# Patient Record
Sex: Female | Born: 1940
Health system: Southern US, Community
[De-identification: ages and names within clinical notes are randomized; demographics above are authoritative.]

## PROBLEM LIST (undated history)

## (undated) DIAGNOSIS — C8338 Diffuse large B-cell lymphoma, lymph nodes of multiple sites: Secondary | ICD-10-CM

## (undated) DIAGNOSIS — C8330 Diffuse large B-cell lymphoma, unspecified site: Secondary | ICD-10-CM

## (undated) HISTORY — DX: Diffuse large b-cell lymphoma, lymph nodes of multiple sites: C83.38

## (undated) HISTORY — DX: Diffuse large B-cell lymphoma, unspecified site: C83.30

## (undated) HISTORY — PX: TONSILLECTOMY: SUR1361

---

## 2009-08-18 ENCOUNTER — Ambulatory Visit: Admission: RE | Admit: 2009-08-18 | Discharge: 2009-08-18 | Payer: Self-pay | Admitting: Gynecologic Oncology

## 2014-01-21 DIAGNOSIS — J309 Allergic rhinitis, unspecified: Secondary | ICD-10-CM | POA: Diagnosis not present

## 2014-01-21 DIAGNOSIS — Z681 Body mass index (BMI) 19 or less, adult: Secondary | ICD-10-CM | POA: Diagnosis not present

## 2014-01-21 DIAGNOSIS — J0100 Acute maxillary sinusitis, unspecified: Secondary | ICD-10-CM | POA: Diagnosis not present

## 2014-01-21 DIAGNOSIS — M810 Age-related osteoporosis without current pathological fracture: Secondary | ICD-10-CM | POA: Diagnosis not present

## 2014-01-21 DIAGNOSIS — M159 Polyosteoarthritis, unspecified: Secondary | ICD-10-CM | POA: Diagnosis not present

## 2014-03-03 DIAGNOSIS — J309 Allergic rhinitis, unspecified: Secondary | ICD-10-CM | POA: Diagnosis not present

## 2014-03-03 DIAGNOSIS — R002 Palpitations: Secondary | ICD-10-CM | POA: Diagnosis not present

## 2014-03-03 DIAGNOSIS — Z681 Body mass index (BMI) 19 or less, adult: Secondary | ICD-10-CM | POA: Diagnosis not present

## 2014-03-03 DIAGNOSIS — M159 Polyosteoarthritis, unspecified: Secondary | ICD-10-CM | POA: Diagnosis not present

## 2014-03-03 DIAGNOSIS — M810 Age-related osteoporosis without current pathological fracture: Secondary | ICD-10-CM | POA: Diagnosis not present

## 2014-05-23 DIAGNOSIS — J0100 Acute maxillary sinusitis, unspecified: Secondary | ICD-10-CM | POA: Diagnosis not present

## 2014-08-21 DIAGNOSIS — H5203 Hypermetropia, bilateral: Secondary | ICD-10-CM | POA: Diagnosis not present

## 2014-08-21 DIAGNOSIS — H43813 Vitreous degeneration, bilateral: Secondary | ICD-10-CM | POA: Diagnosis not present

## 2014-10-24 DIAGNOSIS — Z23 Encounter for immunization: Secondary | ICD-10-CM | POA: Diagnosis not present

## 2014-11-17 DIAGNOSIS — Z01419 Encounter for gynecological examination (general) (routine) without abnormal findings: Secondary | ICD-10-CM | POA: Diagnosis not present

## 2014-11-17 DIAGNOSIS — E039 Hypothyroidism, unspecified: Secondary | ICD-10-CM | POA: Diagnosis not present

## 2014-11-17 DIAGNOSIS — N83202 Unspecified ovarian cyst, left side: Secondary | ICD-10-CM | POA: Diagnosis not present

## 2014-11-17 DIAGNOSIS — N8111 Cystocele, midline: Secondary | ICD-10-CM | POA: Diagnosis not present

## 2014-11-24 DIAGNOSIS — N839 Noninflammatory disorder of ovary, fallopian tube and broad ligament, unspecified: Secondary | ICD-10-CM | POA: Diagnosis not present

## 2014-11-27 DIAGNOSIS — Z1231 Encounter for screening mammogram for malignant neoplasm of breast: Secondary | ICD-10-CM | POA: Diagnosis not present

## 2014-11-28 DIAGNOSIS — R928 Other abnormal and inconclusive findings on diagnostic imaging of breast: Secondary | ICD-10-CM | POA: Diagnosis not present

## 2014-11-28 DIAGNOSIS — R590 Localized enlarged lymph nodes: Secondary | ICD-10-CM | POA: Diagnosis not present

## 2014-12-01 ENCOUNTER — Other Ambulatory Visit (HOSPITAL_COMMUNITY)
Admission: RE | Admit: 2014-12-01 | Disposition: A | Discharge status: Home / Self Care | Payer: Medicare Other | Source: Ambulatory Visit | Attending: Obstetrics and Gynecology | Admitting: Obstetrics and Gynecology

## 2014-12-01 DIAGNOSIS — R590 Localized enlarged lymph nodes: Secondary | ICD-10-CM | POA: Diagnosis not present

## 2014-12-01 DIAGNOSIS — R928 Other abnormal and inconclusive findings on diagnostic imaging of breast: Secondary | ICD-10-CM | POA: Diagnosis not present

## 2014-12-18 DIAGNOSIS — M159 Polyosteoarthritis, unspecified: Secondary | ICD-10-CM | POA: Diagnosis not present

## 2014-12-18 DIAGNOSIS — J309 Allergic rhinitis, unspecified: Secondary | ICD-10-CM | POA: Diagnosis not present

## 2014-12-18 DIAGNOSIS — Z9181 History of falling: Secondary | ICD-10-CM | POA: Diagnosis not present

## 2014-12-18 DIAGNOSIS — J028 Acute pharyngitis due to other specified organisms: Secondary | ICD-10-CM | POA: Diagnosis not present

## 2014-12-18 DIAGNOSIS — Z1389 Encounter for screening for other disorder: Secondary | ICD-10-CM | POA: Diagnosis not present

## 2014-12-18 DIAGNOSIS — M810 Age-related osteoporosis without current pathological fracture: Secondary | ICD-10-CM | POA: Diagnosis not present

## 2014-12-19 ENCOUNTER — Other Ambulatory Visit: Payer: Self-pay | Admitting: Surgery

## 2014-12-19 DIAGNOSIS — R590 Localized enlarged lymph nodes: Secondary | ICD-10-CM | POA: Diagnosis not present

## 2014-12-29 ENCOUNTER — Encounter (HOSPITAL_BASED_OUTPATIENT_CLINIC_OR_DEPARTMENT_OTHER): Payer: Self-pay | Admitting: *Deleted

## 2015-01-06 NOTE — H&P (Signed)
Maria Perez 12/19/2014 11:14 AM Location: [facility] Patient #: [id number] DOB: December 25, 1940 Married / Language: English / Race: White Female   History of Present Illness ([name] [name] MD; 12/19/2014 11:31 AM) The patient is a 74 year old female who presents with lymphadenopathy. This patient for referred by Dr. Lillia Mountain and Dr. Carlena Bjornstad for evaluation of a recently diagnosed atypical lymphoid proliferation and a biopsy left axillary lymph node. I only have copies of the pathology report. Apparently, she had mammograms and ultrasound showing enlarged lymph nodes. A biopsy was done of one lymph nodes in the left axilla showing atypical lymphoid proliferation. She is referred for excisional biopsy throughout lymphoma. Constitutionally, she has had no fevers, chills, or night sweats. She denies fatigue. She generally feels well.   Past Surgical History Elbert Ewings, CMA; 12/19/2014 11:14 AM) Tonsillectomy  Diagnostic Studies History Elbert Ewings, CMA; 12/19/2014 11:14 AM) Colonoscopy 1-5 years ago Mammogram within last year Pap Smear >5 years ago  Allergies Elbert Ewings, CMA; 12/19/2014 11:14 AM) No Known Drug Allergies12/09/2014  Medication History Elbert Ewings, CMA; 12/19/2014 11:15 AM) ZyrTEC Allergy (10MG  Capsule, Oral) Active. Azithromycin (1GM Packet, Oral) Active. Medications Reconciled  Social History Elbert Ewings, [place]; 12/19/2014 11:14 AM) Caffeine use Carbonated beverages, Coffee, Tea. No alcohol use No drug use Tobacco use Never smoker.  Family History Elbert Ewings, [place]; 12/19/2014 11:14 AM) Arthritis Father.  Pregnancy / Birth History Elbert Ewings, CMA; 12/19/2014 11:14 AM) Age at menarche 73 years. Age of menopause <45 Gravida 2 Maternal age 67-35 Para 2    Review of Systems Elbert Ewings CMA; 12/19/2014 11:14 AM) General Not Present- Appetite Loss, Chills, Fatigue, Fever, Night Sweats, Weight Gain and Weight  Loss. Skin Not Present- Change in Wart/Mole, Dryness, Hives, Jaundice, New Lesions, Non-Healing Wounds, Rash and Ulcer. HEENT Not Present- Earache, Hearing Loss, Hoarseness, Nose Bleed, Oral Ulcers, Ringing in the Ears, Seasonal Allergies, Sinus Pain, Sore Throat, Visual Disturbances, Wears glasses/contact lenses and Yellow Eyes. Respiratory Not Present- Bloody sputum, Chronic Cough, Difficulty Breathing, Snoring and Wheezing. Breast Not Present- Breast Mass, Breast Pain, Nipple Discharge and Skin Changes. Cardiovascular Not Present- Chest Pain, Difficulty Breathing Lying Down, Leg Cramps, Palpitations, Rapid Heart Rate, Shortness of Breath and Swelling of Extremities. Gastrointestinal Not Present- Abdominal Pain, Bloating, Bloody Stool, Change in Bowel Habits, Chronic diarrhea, Constipation, Difficulty Swallowing, Excessive gas, Gets full quickly at meals, Hemorrhoids, Indigestion, Nausea, Rectal Pain and Vomiting. Female Genitourinary Not Present- Frequency, Nocturia, Painful Urination, Pelvic Pain and Urgency. Musculoskeletal Not Present- Back Pain, Joint Pain, Joint Stiffness, Muscle Pain, Muscle Weakness and Swelling of Extremities. Neurological Not Present- Decreased Memory, Fainting, Headaches, Numbness, Seizures, Tingling, Tremor, Trouble walking and Weakness. Psychiatric Not Present- Anxiety, Bipolar, Change in Sleep Pattern, Depression, Fearful and Frequent crying. Endocrine Not Present- Cold Intolerance, Excessive Hunger, Hair Changes, Heat Intolerance, Hot flashes and New Diabetes. Hematology Not Present- Easy Bruising, Excessive bleeding, Gland problems, HIV and Persistent Infections.  Vitals Elbert Ewings CMA; 12/19/2014 11:15 AM) 12/19/2014 11:15 AM Weight: 141 lb Height: 65in Body Surface Area: 1.71 m Body Mass Index: 23.46 kg/m  Temp.: 97.22F(Temporal)  Pulse: 71 (Regular)  BP: 124/72 (Sitting, Left Arm, Standard)    Physical Exam (Luverta Korte [name] MD; 12/19/2014  11:32 AM) General Mental Status-Alert. General Appearance-Consistent with stated age. Hydration-Well hydrated. Voice-Normal.  Head and Neck Head-normocephalic, atraumatic with no lesions or palpable masses. Trachea-midline. Thyroid Gland Characteristics - normal size and consistency.  Eye Eyeball - Bilateral-Extraocular movements intact. Sclera/Conjunctiva - Bilateral-No scleral  icterus.  Chest and Lung Exam Chest and lung exam reveals -quiet, even and easy respiratory effort with no use of accessory muscles and on auscultation, normal breath sounds, no adventitious sounds and normal vocal resonance. Inspection Chest Wall - Normal. Back - normal.  Breast Breast - Left-Symmetric, Non Tender, No Biopsy scars, no Dimpling, No Inflammation, No Lumpectomy scars, No Mastectomy scars, No Peau d' Orange. Breast - Right-Symmetric, Non Tender, No Biopsy scars, no Dimpling, No Inflammation, No Lumpectomy scars, No Mastectomy scars, No Peau d' Orange. Breast Lump-No Palpable Breast Mass.  Cardiovascular Cardiovascular examination reveals -normal heart sounds, regular rate and rhythm with no murmurs and normal pedal pulses bilaterally.  Abdomen Inspection Inspection of the abdomen reveals - No Hernias. Skin - Scar - no surgical scars. Palpation/Percussion Palpation and Percussion of the abdomen reveal - Soft, Non Tender, No Rebound tenderness, No Rigidity (guarding) and No hepatosplenomegaly. Auscultation Auscultation of the abdomen reveals - Bowel sounds normal.  Neurologic Neurologic evaluation reveals -alert and oriented x 3 with no impairment of recent or remote memory. Mental Status-Normal.  Musculoskeletal Normal Exam - Left-Upper Extremity Strength Normal and Lower Extremity Strength Normal. Normal Exam - Right-Upper Extremity Strength Normal and Lower Extremity Strength Normal.  Lymphatic Head & Neck  General Head & Neck Lymphatics:  Bilateral - Description - Normal. Note: There is mild shotty adenopathy in her left supraclavicular area Axillary  General Axillary Region: Left: Description: Note: There is minimal adenopathy in both her axilla. Bilateral - Description - Normal. Tenderness - Non Tender. Femoral & Inguinal  Generalized Femoral & Inguinal Lymphatics: Bilateral - Description - Normal. Tenderness - Non Tender.    Assessment & Plan (Brandee Markin [name] MD; 12/19/2014 11:33 AM)  Percell Belt, AXILLARY (R59.0)  Impression: Given the core biopsy of the lymph node, excisional biopsy of left axillary lymph nodes has been recommended for complete histologic evaluation to rule out lymphoma. I discussed the surgical procedure with the patient and her husband. I discussed the risks of surgery which includes but is not limited to bleeding, infection, injury to surrounding structures, chronic seroma formation, the biopsy being nondiagnostic, etc. I will get copies of her original mammograms and the original ultrasound preoperatively. Surgery will be scheduled

## 2015-01-07 ENCOUNTER — Encounter (HOSPITAL_BASED_OUTPATIENT_CLINIC_OR_DEPARTMENT_OTHER)
Admission: RE | Disposition: A | Discharge status: Home / Self Care | Payer: Self-pay | Source: Ambulatory Visit | Attending: Surgery

## 2015-01-07 ENCOUNTER — Ambulatory Visit (HOSPITAL_BASED_OUTPATIENT_CLINIC_OR_DEPARTMENT_OTHER): Payer: Medicare Other | Admitting: Anesthesiology

## 2015-01-07 ENCOUNTER — Ambulatory Visit (HOSPITAL_BASED_OUTPATIENT_CLINIC_OR_DEPARTMENT_OTHER)
Admission: RE | Admit: 2015-01-07 | Discharge: 2015-01-07 | Disposition: A | Discharge status: Home / Self Care | Payer: Medicare Other | Source: Ambulatory Visit | Attending: Surgery | Admitting: Surgery

## 2015-01-07 ENCOUNTER — Encounter (HOSPITAL_BASED_OUTPATIENT_CLINIC_OR_DEPARTMENT_OTHER): Payer: Self-pay | Admitting: Anesthesiology

## 2015-01-07 DIAGNOSIS — C8334 Diffuse large B-cell lymphoma, lymph nodes of axilla and upper limb: Secondary | ICD-10-CM | POA: Diagnosis not present

## 2015-01-07 DIAGNOSIS — R591 Generalized enlarged lymph nodes: Secondary | ICD-10-CM | POA: Diagnosis not present

## 2015-01-07 DIAGNOSIS — C8284 Other types of follicular lymphoma, lymph nodes of axilla and upper limb: Secondary | ICD-10-CM | POA: Insufficient documentation

## 2015-01-07 DIAGNOSIS — C8514 Unspecified B-cell lymphoma, lymph nodes of axilla and upper limb: Secondary | ICD-10-CM | POA: Diagnosis not present

## 2015-01-07 DIAGNOSIS — R599 Enlarged lymph nodes, unspecified: Secondary | ICD-10-CM | POA: Diagnosis present

## 2015-01-07 HISTORY — PX: AXILLARY LYMPH NODE BIOPSY: SHX5737

## 2015-01-07 SURGERY — AXILLARY LYMPH NODE BIOPSY
Anesthesia: General | Site: Axilla | Laterality: Left

## 2015-01-07 MED ORDER — LACTATED RINGERS IV SOLN
INTRAVENOUS | Status: DC
  Administered 2015-01-07: 10 mL/h via INTRAVENOUS

## 2015-01-07 MED ORDER — PHENYLEPHRINE HCL 10 MG/ML IJ SOLN
INTRAMUSCULAR | Status: AC
  Filled 2015-01-07: qty 1

## 2015-01-07 MED ORDER — ONDANSETRON HCL 4 MG/2ML IJ SOLN
INTRAMUSCULAR | Status: AC
  Filled 2015-01-07: qty 2

## 2015-01-07 MED ORDER — PROPOFOL 10 MG/ML IV BOLUS
INTRAVENOUS | Status: DC | PRN
  Administered 2015-01-07: 125 mg via INTRAVENOUS

## 2015-01-07 MED ORDER — ACETAMINOPHEN 650 MG RE SUPP: 650.0000 mg | RECTAL | Status: DC | PRN

## 2015-01-07 MED ORDER — SODIUM CHLORIDE 0.9 % IJ SOLN: 3.0000 mL | Freq: Two times a day (BID) | INTRAMUSCULAR | Status: DC

## 2015-01-07 MED ORDER — FENTANYL CITRATE (PF) 100 MCG/2ML IJ SOLN
INTRAMUSCULAR | Status: AC
  Filled 2015-01-07: qty 2

## 2015-01-07 MED ORDER — OXYCODONE HCL 5 MG PO TABS: 5.0000 mg | ORAL_TABLET | ORAL | Status: DC | PRN

## 2015-01-07 MED ORDER — LIDOCAINE HCL (CARDIAC) 20 MG/ML IV SOLN
INTRAVENOUS | Status: DC | PRN
  Administered 2015-01-07: 50 mg via INTRAVENOUS

## 2015-01-07 MED ORDER — MIDAZOLAM HCL 2 MG/2ML IJ SOLN: 1.0000 mg | INTRAMUSCULAR | Status: DC | PRN

## 2015-01-07 MED ORDER — BUPIVACAINE-EPINEPHRINE 0.5% -1:200000 IJ SOLN
INTRAMUSCULAR | Status: DC | PRN
  Administered 2015-01-07: 10 mL

## 2015-01-07 MED ORDER — SCOPOLAMINE 1 MG/3DAYS TD PT72: 1.0000 | MEDICATED_PATCH | Freq: Once | TRANSDERMAL | Status: DC | PRN

## 2015-01-07 MED ORDER — CEFAZOLIN SODIUM-DEXTROSE 2-3 GM-% IV SOLR
2.0000 g | INTRAVENOUS | Status: AC
  Administered 2015-01-07: 2 g via INTRAVENOUS

## 2015-01-07 MED ORDER — PROPOFOL 10 MG/ML IV BOLUS
INTRAVENOUS | Status: AC
  Filled 2015-01-07: qty 40

## 2015-01-07 MED ORDER — BUPIVACAINE-EPINEPHRINE (PF) 0.5% -1:200000 IJ SOLN
INTRAMUSCULAR | Status: AC
  Filled 2015-01-07: qty 30

## 2015-01-07 MED ORDER — GLYCOPYRROLATE 0.2 MG/ML IJ SOLN: 0.2000 mg | Freq: Once | INTRAMUSCULAR | Status: DC | PRN

## 2015-01-07 MED ORDER — CEFAZOLIN SODIUM-DEXTROSE 2-3 GM-% IV SOLR
INTRAVENOUS | Status: AC
  Filled 2015-01-07: qty 50

## 2015-01-07 MED ORDER — HYDROCODONE-ACETAMINOPHEN 5-325 MG PO TABS: 1.0000 | ORAL_TABLET | ORAL | Status: DC | PRN

## 2015-01-07 MED ORDER — SODIUM CHLORIDE 0.9 % IJ SOLN: 3.0000 mL | INTRAMUSCULAR | Status: DC | PRN

## 2015-01-07 MED ORDER — MORPHINE SULFATE (PF) 2 MG/ML IV SOLN: 1.0000 mg | INTRAVENOUS | Status: DC | PRN

## 2015-01-07 MED ORDER — FENTANYL CITRATE (PF) 100 MCG/2ML IJ SOLN
25.0000 ug | INTRAMUSCULAR | Status: DC | PRN
  Administered 2015-01-07 (×2): 25 ug via INTRAVENOUS

## 2015-01-07 MED ORDER — FENTANYL CITRATE (PF) 100 MCG/2ML IJ SOLN: 50.0000 ug | INTRAMUSCULAR | Status: DC | PRN

## 2015-01-07 MED ORDER — MEPERIDINE HCL 25 MG/ML IJ SOLN: 6.2500 mg | INTRAMUSCULAR | Status: DC | PRN

## 2015-01-07 MED ORDER — MIDAZOLAM HCL 2 MG/2ML IJ SOLN
INTRAMUSCULAR | Status: AC
  Filled 2015-01-07: qty 2

## 2015-01-07 MED ORDER — SODIUM CHLORIDE 0.9 % IV SOLN: 250.0000 mL | INTRAVENOUS | Status: DC | PRN

## 2015-01-07 MED ORDER — PHENYLEPHRINE HCL 10 MG/ML IJ SOLN
INTRAMUSCULAR | Status: DC | PRN
  Administered 2015-01-07: 80 ug via INTRAVENOUS

## 2015-01-07 MED ORDER — FENTANYL CITRATE (PF) 100 MCG/2ML IJ SOLN
INTRAMUSCULAR | Status: DC | PRN
  Administered 2015-01-07: 100 ug via INTRAVENOUS

## 2015-01-07 MED ORDER — DEXAMETHASONE SODIUM PHOSPHATE 4 MG/ML IJ SOLN
INTRAMUSCULAR | Status: DC | PRN
  Administered 2015-01-07: 10 mg via INTRAVENOUS

## 2015-01-07 MED ORDER — DEXAMETHASONE SODIUM PHOSPHATE 10 MG/ML IJ SOLN
INTRAMUSCULAR | Status: AC
  Filled 2015-01-07: qty 1

## 2015-01-07 MED ORDER — LIDOCAINE HCL (CARDIAC) 20 MG/ML IV SOLN
INTRAVENOUS | Status: AC
  Filled 2015-01-07: qty 5

## 2015-01-07 MED ORDER — ACETAMINOPHEN 325 MG PO TABS: 650.0000 mg | ORAL_TABLET | ORAL | Status: DC | PRN

## 2015-01-07 SURGICAL SUPPLY — 48 items
APPLIER CLIP 9.375 MED OPEN (MISCELLANEOUS)
BLADE HEX COATED 2.75 (ELECTRODE) ×3 IMPLANT
BLADE SURG 15 STRL LF DISP TIS (BLADE) ×1 IMPLANT
BLADE SURG 15 STRL SS (BLADE) ×2
CANISTER SUCT 1200ML W/VALVE (MISCELLANEOUS) IMPLANT
CHLORAPREP W/TINT 26ML (MISCELLANEOUS) ×3 IMPLANT
CLIP APPLIE 9.375 MED OPEN (MISCELLANEOUS) IMPLANT
COVER BACK TABLE 60X90IN (DRAPES) ×3 IMPLANT
COVER MAYO STAND STRL (DRAPES) ×3 IMPLANT
DECANTER SPIKE VIAL GLASS SM (MISCELLANEOUS) IMPLANT
DRAIN CHANNEL 19F RND (DRAIN) IMPLANT
DRAIN PENROSE 1/4X12 LTX STRL (WOUND CARE) IMPLANT
DRAPE LAPAROSCOPIC ABDOMINAL (DRAPES) IMPLANT
DRAPE LAPAROTOMY 100X72 PEDS (DRAPES) ×3 IMPLANT
DRAPE LAPAROTOMY T 98X78 PEDS (DRAPES) ×3 IMPLANT
DRAPE UTILITY XL STRL (DRAPES) ×3 IMPLANT
ELECT REM PT RETURN 9FT ADLT (ELECTROSURGICAL) ×3
ELECTRODE REM PT RTRN 9FT ADLT (ELECTROSURGICAL) ×1 IMPLANT
EVACUATOR SILICONE 100CC (DRAIN) IMPLANT
GLOVE BIOGEL PI IND STRL 7.0 (GLOVE) ×1 IMPLANT
GLOVE BIOGEL PI INDICATOR 7.0 (GLOVE) ×2
GLOVE ECLIPSE 6.5 STRL STRAW (GLOVE) ×3 IMPLANT
GLOVE EXAM NITRILE MD LF STRL (GLOVE) ×3 IMPLANT
GLOVE SURG SIGNA 7.5 PF LTX (GLOVE) ×3 IMPLANT
GOWN STRL REUS W/ TWL LRG LVL3 (GOWN DISPOSABLE) ×1 IMPLANT
GOWN STRL REUS W/ TWL XL LVL3 (GOWN DISPOSABLE) ×1 IMPLANT
GOWN STRL REUS W/TWL LRG LVL3 (GOWN DISPOSABLE) ×2
GOWN STRL REUS W/TWL XL LVL3 (GOWN DISPOSABLE) ×2
LIQUID BAND (GAUZE/BANDAGES/DRESSINGS) ×3 IMPLANT
NEEDLE HYPO 25X1 1.5 SAFETY (NEEDLE) ×3 IMPLANT
NS IRRIG 1000ML POUR BTL (IV SOLUTION) IMPLANT
PACK BASIN DAY SURGERY FS (CUSTOM PROCEDURE TRAY) ×3 IMPLANT
PENCIL BUTTON HOLSTER BLD 10FT (ELECTRODE) ×3 IMPLANT
PIN SAFETY STERILE (MISCELLANEOUS) IMPLANT
SLEEVE SCD COMPRESS KNEE MED (MISCELLANEOUS) ×3 IMPLANT
SPONGE LAP 4X18 X RAY DECT (DISPOSABLE) ×3 IMPLANT
STAPLER VISISTAT 35W (STAPLE) IMPLANT
SUT ETHILON 2 0 FS 18 (SUTURE) IMPLANT
SUT MNCRL AB 4-0 PS2 18 (SUTURE) ×3 IMPLANT
SUT VIC AB 3-0 SH 27 (SUTURE) ×2
SUT VIC AB 3-0 SH 27X BRD (SUTURE) ×1 IMPLANT
SYR BULB 3OZ (MISCELLANEOUS) IMPLANT
SYR CONTROL 10ML LL (SYRINGE) ×3 IMPLANT
TOWEL OR 17X24 6PK STRL BLUE (TOWEL DISPOSABLE) ×3 IMPLANT
TOWEL OR NON WOVEN STRL DISP B (DISPOSABLE) ×3 IMPLANT
TUBE CONNECTING 20'X1/4 (TUBING)
TUBE CONNECTING 20X1/4 (TUBING) IMPLANT
YANKAUER SUCT BULB TIP NO VENT (SUCTIONS) ×3 IMPLANT

## 2015-01-07 NOTE — Anesthesia Postprocedure Evaluation (Signed)
Anesthesia Post Note  Patient: Maria Perez  Procedure(s) Performed: Procedure(s) (LRB): EXCISIONAL LEFT AXILLARY LYMPH NODE BIOPSY (Left)  Patient location during evaluation: PACU Anesthesia Type: General Level of consciousness: awake and alert Pain management: pain level controlled Vital Signs Assessment: post-procedure vital signs reviewed and stable Respiratory status: spontaneous breathing, nonlabored ventilation and respiratory function stable Cardiovascular status: blood pressure returned to baseline and stable Postop Assessment: no signs of nausea or vomiting Anesthetic complications: no    Last Vitals:  Filed Vitals:   01/07/15 1015 01/07/15 1030  BP: 117/67 121/60  Pulse: 82 79  Temp:    Resp: 12 8    Last Pain:  Filed Vitals:   01/07/15 1035  PainSc: 4                  Chue Berkovich A

## 2015-01-07 NOTE — Op Note (Signed)
NAMEESTIE, HEGDE NO.:  [id number]  MEDICAL RECORD NO.:  [id number]  LOCATION:                               FACILITY:  [facility]  PHYSICIAN:  Coralie Keens, M.D. DATE OF BIRTH:  1940-03-07  DATE OF PROCEDURE:  01/07/2015 DATE OF DISCHARGE:                              OPERATIVE REPORT   PREOPERATIVE DIAGNOSIS:  Lymphadenopathy.  POSTOPERATIVE DIAGNOSIS:  Lymphadenopathy.  PROCEDURE:  Excisional biopsy of left axillary lymph node.  SURGEON:  Coralie Keens, M.D.  ANESTHESIA:  General and 0.5% Marcaine.  ESTIMATED BLOOD LOSS:  Minimal.  INDICATIONS:  This is a 74 year old female, who was found on screening mammography to have enlarged lymph nodes in her left axilla without evidence of breast mass.  A stereotactic biopsy was performed which showed abnormal lymphoid proliferation, worrisome for malignancy. Decision was made to proceed with excisional biopsy.  PROCEDURE IN DETAIL:  The patient was brought to the operating room, identified as Maria Perez.  She was placed supine on the operating table and general anesthesia induced.  Her left axilla was then prepped and draped in usual sterile fashion.  I anesthetized skin with Marcaine, I then made an incision in the axilla with a scalpel.  I took this down to the axillary tissue with the electrocautery.  I could palpate 1 large lymph node, which was moderately deep.  I was able to grip with a Babcock clamp and elevated and then excised it with the electrocautery. This lymph node was approximately 3 cm in size.  It was sent to Pathology for evaluation.  I palpated the axilla extensively and could not really appreciate any other enlarged lymph nodes.  At this point, hemostasis appeared to be achieved.  I anesthetized the wound further with Marcaine.  I then closed subcutaneous tissue with interrupted 3-0 Vicryl sutures and closed the skin with a running 4-0 Monocryl.  Skin glue was then applied.  The  patient tolerated the procedure well.  All the counts were correct at the end of the procedure.  The patient was then extubated in the operating room and taken in a stable condition to the recovery room.     Coralie Keens, M.D.     DB/MEDQ  D:  01/07/2015  T:  01/07/2015  Job:  [id number]

## 2015-01-07 NOTE — Anesthesia Preprocedure Evaluation (Signed)

## 2015-01-07 NOTE — Interval H&P Note (Signed)
History and Physical Interval Note: no change in H and P  01/07/2015 8:58 AM  Maria Perez  has presented today for surgery, with the diagnosis of   Lymphadenopathy  The various methods of treatment have been discussed with the patient and family. After consideration of risks, benefits and other options for treatment, the patient has consented to  Procedure(s): EXCISIONAL LEFT AXILLARY LYMPH NODE BIOPSY (Left) as a surgical intervention .  The patient's history has been reviewed, patient examined, no change in status, stable for surgery.  I have reviewed the patient's chart and labs.  Questions were answered to the patient's satisfaction.     [name]

## 2015-01-07 NOTE — Anesthesia Procedure Notes (Signed)
Procedure Name: LMA Insertion Date/Time: 01/07/2015 9:18 AM Performed by: Toula Moos L Pre-anesthesia Checklist: Patient identified, Emergency Drugs available, Suction available, Patient being monitored and Timeout performed Patient Re-evaluated:Patient Re-evaluated prior to inductionOxygen Delivery Method: Circle System Utilized Preoxygenation: Pre-oxygenation with 100% oxygen Intubation Type: IV induction Ventilation: Mask ventilation without difficulty LMA: LMA inserted LMA Size: 4.0 Number of attempts: 1 Airway Equipment and Method: Bite block Placement Confirmation: positive ETCO2 Tube secured with: Tape Dental Injury: Teeth and Oropharynx as per pre-operative assessment

## 2015-01-07 NOTE — Op Note (Signed)
EXCISIONAL LEFT AXILLARY LYMPH NODE BIOPSY  Procedure Note  DARRILYN HORTIN 01/07/2015   Pre-op Diagnosis:   Lymphadenopathy     Post-op Diagnosis: same  Procedure(s): EXCISIONAL LEFT AXILLARY LYMPH NODE BIOPSY  Surgeon(s): Coralie Keens, MD  Anesthesia: General  Staff:  Circulator: Maurene Capes, RN Relief Scrub: Beryle Flock Bouchillon, RN Scrub Person: Rhea Pink Neiers, CST  Estimated Blood Loss: Minimal               Specimens: sent to path          [facility] A   Date: 01/07/2015  Time: 9:47 AM

## 2015-01-07 NOTE — Discharge Instructions (Signed)
Ok to shower starting tomorrow  Ice pack and ibuprofen also for pain.  Ok to walk at the gym starting tomorrow   Call your surgeon if you experience:   1.  Fever over 101.0. 2.  Inability to urinate. 3.  Nausea and/or vomiting. 4.  Extreme swelling or bruising at the surgical site. 5.  Continued bleeding from the incision. 6.  Increased pain, redness or drainage from the incision. 7.  Problems related to your pain medication. 8.  Any problems and/or concerns    Post Anesthesia Home Care Instructions  Activity: Get plenty of rest for the remainder of the day. A responsible adult should stay with you for 24 hours following the procedure.  For the next 24 hours, DO NOT: -Drive a car -Paediatric nurse -Drink alcoholic beverages -Take any medication unless instructed by your physician -Make any legal decisions or sign important papers.  Meals: Start with liquid foods such as gelatin or soup. Progress to regular foods as tolerated. Avoid greasy, spicy, heavy foods. If nausea and/or vomiting occur, drink only clear liquids until the nausea and/or vomiting subsides. Call your physician if vomiting continues.  Special Instructions/Symptoms: Your throat may feel dry or sore from the anesthesia or the breathing tube placed in your throat during surgery. If this causes discomfort, gargle with warm salt water. The discomfort should disappear within 24 hours.  If you had a scopolamine patch placed behind your ear for the management of post- operative nausea and/or vomiting:  1. The medication in the patch is effective for 72 hours, after which it should be removed.  Wrap patch in a tissue and discard in the trash. Wash hands thoroughly with soap and water. 2. You may remove the patch earlier than 72 hours if you experience unpleasant side effects which may include dry mouth, dizziness or visual disturbances. 3. Avoid touching the patch. Wash your hands with soap and water after contact  with the patch.

## 2015-01-07 NOTE — Transfer of Care (Signed)
Immediate Anesthesia Transfer of Care Note  Patient: Maria Perez  Procedure(s) Performed: Procedure(s): EXCISIONAL LEFT AXILLARY LYMPH NODE BIOPSY (Left)  Patient Location: PACU  Anesthesia Type:General  Level of Consciousness: awake and patient cooperative  Airway & Oxygen Therapy: Patient Spontanous Breathing and Patient connected to face mask oxygen  Post-op Assessment: Report given to RN and Post -op Vital signs reviewed and stable  Post vital signs: Reviewed and stable  Last Vitals:  Filed Vitals:   01/07/15 0832  BP: 133/80  Pulse: 87  Temp: 36.3 C  Resp: 18    Complications: No apparent anesthesia complications

## 2015-01-08 ENCOUNTER — Encounter (HOSPITAL_BASED_OUTPATIENT_CLINIC_OR_DEPARTMENT_OTHER): Payer: Self-pay | Admitting: Surgery

## 2015-01-15 ENCOUNTER — Telehealth: Payer: Self-pay | Admitting: Hematology and Oncology

## 2015-01-15 NOTE — Telephone Encounter (Signed)
PT AWARE OF NP APPT ON 01/16/15@10 :30-PER DR [facility]

## 2015-01-16 ENCOUNTER — Ambulatory Visit (HOSPITAL_BASED_OUTPATIENT_CLINIC_OR_DEPARTMENT_OTHER): Payer: Medicare Other | Admitting: Hematology and Oncology

## 2015-01-16 ENCOUNTER — Encounter: Payer: Self-pay | Admitting: Hematology and Oncology

## 2015-01-16 ENCOUNTER — Telehealth: Payer: Self-pay | Admitting: Hematology and Oncology

## 2015-01-16 VITALS — BP 126/68 | HR 77 | Temp 97.8°F | Resp 18 | Wt 141.4 lb

## 2015-01-16 DIAGNOSIS — Z8579 Personal history of other malignant neoplasms of lymphoid, hematopoietic and related tissues: Secondary | ICD-10-CM | POA: Insufficient documentation

## 2015-01-16 DIAGNOSIS — C8338 Diffuse large B-cell lymphoma, lymph nodes of multiple sites: Secondary | ICD-10-CM

## 2015-01-16 DIAGNOSIS — C8334 Diffuse large B-cell lymphoma, lymph nodes of axilla and upper limb: Secondary | ICD-10-CM | POA: Diagnosis not present

## 2015-01-16 DIAGNOSIS — C8330 Diffuse large B-cell lymphoma, unspecified site: Secondary | ICD-10-CM

## 2015-01-16 HISTORY — DX: Diffuse large B-cell lymphoma, unspecified site: C83.30

## 2015-01-16 HISTORY — DX: Diffuse large b-cell lymphoma, lymph nodes of multiple sites: C83.38

## 2015-01-16 NOTE — Telephone Encounter (Signed)
Gave patient avs report and appointments for January. Per 1/6 pof ched class 1/11 or 1/12 - patient scheduled for ched 1/12 prior to f/u. Echo to [facility] for Margaret and central radiology will call re pet scan - patient aware.

## 2015-01-16 NOTE — Assessment & Plan Note (Signed)
We discussed the importance of staging. My examination only revealed lymphadenopathy on the left axilla. I recommend staging PET CT scan. If PET CT scan showed localized disease, I would not perform bone marrow aspirate and biopsy. I gave her a general outline of approach to management of lymphoma. She would need treatment with R- CHOP chemotherapy and I will schedule baseline echocardiogram. I will schedule chemotherapy education class and bring her back next week to discuss test results and schedule treatment.

## 2015-01-16 NOTE — Telephone Encounter (Signed)
Per response from [facility] for echo. Spoke with spouse re echo for 1/12 @ 9 am at [facility].

## 2015-01-16 NOTE — Progress Notes (Signed)
[place] NOTE  Patient Care Team: Signe Colt, MD as PCP - General (Obstetrics and Gynecology) Coralie Keens, MD as Consulting Physician (General Surgery)  CHIEF COMPLAINTS/PURPOSE OF CONSULTATION:  Follicular lymphoma and diffuse large B-cell lymphoma  HISTORY OF PRESENTING ILLNESS:  Maria Perez 75 y.o. female is here because of recent diagnosis of lymphoma. The patient is otherwise healthy. She underwent screening mammogram last year and mammogram pickup abnormality in regional lymph nodes. She originally underwent fine-needle as rebiopsy which was nondiagnostic. Last month, she underwent excisional lymph node biopsy of the left axilla today came back positive for lymphoma. She denies of adenopathy elsewhere. Denies anorexia, abnormal weight loss, skin itching and night sweats. She is up-to-date with all screening programs.   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 Diffuse large B cell lymphoma ([place]) 01/16/2015    SURGICAL HISTORY: Past Surgical History  Procedure Laterality Date  . Tonsillectomy    . Axillary lymph node biopsy Left 01/07/2015    Procedure: EXCISIONAL LEFT AXILLARY LYMPH NODE BIOPSY;  Surgeon: Coralie Keens, MD;  Location: [facility];  Service: General;  Laterality: Left;    SOCIAL HISTORY: Social History   Social History  . Marital Status: Married    Spouse Name: N/A  . Number of Children: N/A  . Years of Education: N/A   Occupational History  . Not on file.   Social History Main Topics  . Smoking status: Never Smoker   . Smokeless tobacco: Never Used  . Alcohol Use: No  . Drug Use: No  . Sexual Activity: Not on file     Comment: married, retired Field se[employer]ologist, 2  daughters, married 90 years   Other Topics Concern  . Not on file   Social History Narrative    FAMILY HISTORY: Family History  Problem Relation Age of Onset  . Emphysema Father     ALLERGIES:  has No Known Allergies.  MEDICATIONS:  Current Outpatient Prescriptions  Medication Sig Dispense Refill  . calcium carbonate (OSCAL) 1500 (600 CA) MG TABS tablet Take by mouth 2 (two) times daily with a meal.    . cetirizine (ZYRTEC) 10 MG tablet Take 10 mg by mouth daily.     No current facility-administered medications for this visit.    REVIEW OF SYSTEMS:   Constitutional: Denies fevers, chills or abnormal night sweats Eyes: Denies blurriness of vision, double vision or watery eyes Ears, nose, mouth, throat, and face: Denies mucositis or sore throat Respiratory: Denies cough, dyspnea or wheezes Cardiovascular: Denies palpitation, chest discomfort or lower extremity swelling Gastrointestinal:  Denies nausea, heartburn or change in bowel habits Skin: Denies abnormal skin rashes Neurological:Denies numbness, tingling or new weaknesses Behavioral/Psych: Mood is stable, no new changes  All other systems were reviewed with the patient and are negative.  PHYSICAL EXAMINATION: ECOG PERFORMANCE STATUS: 0 - Asymptomatic  Filed Vitals:   01/16/15 1020  BP: 126/68  Pulse: 77  Temp: 97.8 F (36.6 C)  Resp: 18   Filed Weights   01/16/15 1020  Weight: 141 lb 6.4 oz (64.139 kg)    GENERAL:alert, no distress and comfortable SKIN: skin color, texture, turgor are normal, no rashes or significant lesions EYES: normal, conjunctiva are pink and non-injected, sclera clear OROPHARYNX:no  exudate, no erythema and lips, buccal mucosa, and tongue normal  NECK: supple, thyroid normal size, non-tender, without nodularity LYMPH:  no palpable lymphadenopathy in the cervical, axillary or inguinal. 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 My examination only revealed lymphadenopathy on the left axilla. I recommend staging PET CT scan. If PET CT scan showed localized disease, I would not perform bone marrow aspirate and biopsy. I gave her a general outline of approach to management of lymphoma. She would need treatment with R- CHOP chemotherapy and I will schedule baseline echocardiogram. I will schedule chemotherapy education class and bring her back next week to discuss test results and schedule treatment.   Orders Placed This Encounter  Procedures  . NM PET Image Restage (PS) Whole Body    Standing Status: Future     Number of Occurrences:      Standing Expiration Date: 03/17/2016    Order Specific Question:  Reason for Exam (SYMPTOM  OR DIAGNOSIS REQUIRED)    Answer:  staging lymphoma    Order Specific Question:  Preferred imaging location?    Answer:  [facility]  . ECHOCARDIOGRAM COMPLETE    Standing Status: Future     Number of Occurrences:      Standing Expiration Date: 04/16/2016    Order Specific Question:  Where should this test be performed    Answer:  [facility]    Order Specific Question:  Complete or Limited study?    Answer:  Complete    Order Specific Question:  With Image Enhancing Agent or without Image Enhancing Agent?    Answer:  Without Image Enhancing Agent    Order Specific Question:  Contraindication for Image Enhancing Agent?    Answer:  Per Appointment Conversion    Order Specific Question:  Reason for exam-Echo    Answer:  Chemo  V67.2 / Z09      All  questions were answered. The patient knows to call the clinic with any problems, questions or concerns. I spent 40 minutes counseling the patient face to face. The total time spent in the appointment was 60 minutes and more than 50% was on counseling.     [facility], Sedric Guia, MD 01/16/2015 1:06 PM

## 2015-01-19 ENCOUNTER — Telehealth: Payer: Self-pay | Admitting: Hematology and Oncology

## 2015-01-19 ENCOUNTER — Other Ambulatory Visit: Payer: Self-pay | Admitting: Hematology and Oncology

## 2015-01-19 ENCOUNTER — Encounter (HOSPITAL_COMMUNITY)
Admission: RE | Admit: 2015-01-19 | Discharge: 2015-01-19 | Disposition: A | Discharge status: Home / Self Care | Payer: Medicare Other | Source: Ambulatory Visit | Attending: Hematology and Oncology | Admitting: Hematology and Oncology

## 2015-01-19 ENCOUNTER — Encounter: Payer: Self-pay | Admitting: Hematology and Oncology

## 2015-01-19 ENCOUNTER — Other Ambulatory Visit: Payer: Self-pay | Admitting: *Deleted

## 2015-01-19 ENCOUNTER — Telehealth: Payer: Self-pay | Admitting: *Deleted

## 2015-01-19 DIAGNOSIS — C8334 Diffuse large B-cell lymphoma, lymph nodes of axilla and upper limb: Secondary | ICD-10-CM | POA: Insufficient documentation

## 2015-01-19 DIAGNOSIS — C8338 Diffuse large B-cell lymphoma, lymph nodes of multiple sites: Secondary | ICD-10-CM

## 2015-01-19 DIAGNOSIS — C8510 Unspecified B-cell lymphoma, unspecified site: Secondary | ICD-10-CM | POA: Diagnosis not present

## 2015-01-19 MED ORDER — FLUDEOXYGLUCOSE F - 18 (FDG) INJECTION
7.0000 | Freq: Once | INTRAVENOUS | Status: AC | PRN
  Administered 2015-01-19: 7 via INTRAVENOUS

## 2015-01-19 NOTE — Telephone Encounter (Signed)
Informed husband of BMBx scheduled for tomorrow morning at our clinic.  Pt needs to arrive at 7:30 am to check in for procedure at 8 am.   Ok to eat breakfast,  Take regular meds before procedure.  He verbalized understanding. Eulas Post in [employer] notified of BMBx for tomorrow.

## 2015-01-19 NOTE — Telephone Encounter (Signed)
Appointments made and patient will get an avs 1/10

## 2015-01-19 NOTE — Telephone Encounter (Signed)
I reviewed result or PET CT scan with the patient. She have at least stage III disease on PET CT scan. I recommend we proceed with bone marrow aspirate and biopsy and blood work tomorrow and keep her appointment on Thursday to review everything. She would require minimum 6 cycles of CHOP chemotherapy and I recommend port placement prior to chemo. I will get my nurse to call the surgeon's office requesting a port to be placed ASAP. I will revealed most test results with the patient on Thursday.

## 2015-01-20 ENCOUNTER — Other Ambulatory Visit (HOSPITAL_BASED_OUTPATIENT_CLINIC_OR_DEPARTMENT_OTHER): Payer: Medicare Other

## 2015-01-20 ENCOUNTER — Encounter: Payer: Self-pay | Admitting: Hematology and Oncology

## 2015-01-20 ENCOUNTER — Other Ambulatory Visit (HOSPITAL_COMMUNITY)
Admission: RE | Admit: 2015-01-20 | Discharge: 2015-01-20 | Disposition: A | Discharge status: Home / Self Care | Payer: Medicare Other | Source: Ambulatory Visit | Attending: Hematology and Oncology | Admitting: Hematology and Oncology

## 2015-01-20 ENCOUNTER — Ambulatory Visit (HOSPITAL_BASED_OUTPATIENT_CLINIC_OR_DEPARTMENT_OTHER): Payer: Medicare Other | Admitting: Hematology and Oncology

## 2015-01-20 ENCOUNTER — Other Ambulatory Visit: Payer: Self-pay | Admitting: Surgery

## 2015-01-20 VITALS — BP 123/69 | HR 81 | Temp 98.2°F

## 2015-01-20 DIAGNOSIS — C8338 Diffuse large B-cell lymphoma, lymph nodes of multiple sites: Secondary | ICD-10-CM

## 2015-01-20 DIAGNOSIS — D7589 Other specified diseases of blood and blood-forming organs: Secondary | ICD-10-CM | POA: Diagnosis not present

## 2015-01-20 LAB — CBC WITH DIFFERENTIAL/PLATELET
BASO%: 0.8 % (ref 0.0–2.0)
BASOS ABS: 0 10*3/uL (ref 0.0–0.1)
EOS ABS: 0.1 10*3/uL (ref 0.0–0.5)
EOS%: 1.4 % (ref 0.0–7.0)
HCT: 43.9 % (ref 34.8–46.6)
HEMOGLOBIN: 14.2 g/dL (ref 11.6–15.9)
LYMPH%: 20 % (ref 14.0–49.7)
MCH: 28.6 pg (ref 25.1–34.0)
MCHC: 32.4 g/dL (ref 31.5–36.0)
MCV: 88.4 fL (ref 79.5–101.0)
MONO#: 0.5 10*3/uL (ref 0.1–0.9)
MONO%: 11.3 % (ref 0.0–14.0)
NEUT%: 66.5 % (ref 38.4–76.8)
NEUTROS ABS: 3.2 10*3/uL (ref 1.5–6.5)
Platelets: 266 10*3/uL (ref 145–400)
RBC: 4.97 10*6/uL (ref 3.70–5.45)
RDW: 13.1 % (ref 11.2–14.5)
WBC: 4.8 10*3/uL (ref 3.9–10.3)
lymph#: 1 10*3/uL (ref 0.9–3.3)

## 2015-01-20 LAB — COMPREHENSIVE METABOLIC PANEL
ALBUMIN: 4.1 g/dL (ref 3.5–5.0)
ALK PHOS: 102 U/L (ref 40–150)
ALT: 19 U/L (ref 0–55)
AST: 21 U/L (ref 5–34)
Anion Gap: 9 mEq/L (ref 3–11)
BUN: 21.6 mg/dL (ref 7.0–26.0)
CO2: 25 meq/L (ref 22–29)
Calcium: 9.3 mg/dL (ref 8.4–10.4)
Chloride: 105 mEq/L (ref 98–109)
Creatinine: 0.8 mg/dL (ref 0.6–1.1)
EGFR: 68 mL/min/{1.73_m2} — ABNORMAL LOW (ref >90)
GLUCOSE: 100 mg/dL (ref 70–140)
POTASSIUM: 4.1 meq/L (ref 3.5–5.1)
SODIUM: 139 meq/L (ref 136–145)
TOTAL PROTEIN: 6.8 g/dL (ref 6.4–8.3)
Total Bilirubin: 0.62 mg/dL (ref 0.20–1.20)

## 2015-01-20 LAB — GLUCOSE, CAPILLARY: Glucose-Capillary: 93 mg/dL (ref 65–99)

## 2015-01-20 LAB — BONE MARROW EXAM

## 2015-01-20 LAB — LACTATE DEHYDROGENASE: LDH: 154 U/L (ref 125–245)

## 2015-01-20 LAB — URIC ACID: URIC ACID, SERUM: 3.8 mg/dL (ref 2.6–7.4)

## 2015-01-20 NOTE — Assessment & Plan Note (Signed)
I reviewed her PET CT scan with the patient and family and discussed the importance of staging bone marrow biopsy. She agreed to proceed. I will tentatively schedule her to start Chemotherapy next week. I have requested her surgeon for port placement ASAP.

## 2015-01-20 NOTE — Progress Notes (Signed)
[place] OFFICE PROGRESS NOTE  Patient Care Team: Signe Colt, MD as PCP - General (Obstetrics and Gynecology) Coralie Keens, MD as Consulting Physician (General Surgery)  SUMMARY OF ONCOLOGIC HISTORY:   Diffuse large b-cell lymphoma, lymph nodes of multiple sites [facility])   01/07/2015 Procedure She had excisional biopsy of left axilla   01/07/2015 Pathology Results Accession: [id number] showed follicular and DLBCL   0/08/6759 Imaging PET scan showed stage III disease    INTERVAL HISTORY: Please see below for problem oriented charting. She returns for further follow-up. She feels well.  REVIEW OF SYSTEMS:   Constitutional: Denies fevers, chills or abnormal weight loss Eyes: Denies blurriness of vision Ears, nose, mouth, throat, and face: Denies mucositis or sore throat Respiratory: Denies cough, dyspnea or wheezes Cardiovascular: Denies palpitation, chest discomfort or lower extremity swelling Gastrointestinal:  Denies nausea, heartburn or change in bowel habits Skin: Denies abnormal skin rashes Lymphatics: Denies new lymphadenopathy or easy bruising Neurological:Denies numbness, tingling or new weaknesses Behavioral/Psych: Mood is stable, no new changes  All other systems were reviewed with the patient and are negative.  I have reviewed the past medical history, past surgical history, social history and family history with the patient and they are unchanged from previous note.  ALLERGIES:  has No Known Allergies.  MEDICATIONS:  Current Outpatient Prescriptions  Medication Sig Dispense Refill  . calcium carbonate (OSCAL) 1500 (600 CA) MG TABS tablet Take by mouth 2 (two) times daily with a meal.    . cetirizine (ZYRTEC) 10 MG tablet Take 10 mg by mouth daily.     No current facility-administered medications for this visit.    PHYSICAL EXAMINATION: ECOG PERFORMANCE STATUS: 0 - Asymptomatic  Filed Vitals:   01/20/15 0738 01/20/15 0830  BP: 123/80  123/69  Pulse: 85 81  Temp: 98.2 F (36.8 C)    There were no vitals filed for this visit.  GENERAL:alert, no distress and comfortable SKIN: skin color, texture, turgor are normal, no rashes or significant lesions EYES: normal, Conjunctiva are pink and non-injected, sclera clear Musculoskeletal:no cyanosis of digits and no clubbing  NEURO: alert & oriented x 3 with fluent speech, no focal motor/sensory deficits  LABORATORY DATA:  I have reviewed the data as listed No results found for: NA, K, CL, CO2, GLUCOSE, BUN, CREATININE, CALCIUM, PROT, ALBUMIN, AST, ALT, ALKPHOS, BILITOT, GFRNONAA, GFRAA  No results found for: SPEP, UPEP  Lab Results  Component Value Date   WBC 4.8 01/20/2015   NEUTROABS 3.2 01/20/2015   HGB 14.2 01/20/2015   HCT 43.9 01/20/2015   MCV 88.4 01/20/2015   PLT 266 01/20/2015      Chemistry   No results found for: NA, K, CL, CO2, BUN, CREATININE, GLU No results found for: CALCIUM, ALKPHOS, AST, ALT, BILITOT     RADIOGRAPHIC STUDIES: I have personally reviewed the radiological images as listed and agreed with the findings in the report. Nm Pet Image Initial (pi) Skull Base To Thigh  01/19/2015  CLINICAL DATA:  Initial treatment strategy for diffuse large B-cell lymphoma. Initial diagnosis following biopsy of LEFT axillary lymph node. EXAM: NUCLEAR MEDICINE PET SKULL BASE TO THIGH TECHNIQUE: 7.0 mCi F-18 FDG was injected intravenously. Full-ring PET imaging was performed from the skull base to thigh after the radiotracer. CT data was obtained and used for attenuation correction and anatomic localization. FASTING BLOOD GLUCOSE:  Value: 93 mg/dl COMPARISON:  None. FINDINGS: NECK No hypermetabolic lymph nodes in the neck. CHEST Mild metabolic activity associated  with residual small lymph nodes in the LEFT axilla. For example a 5 mm short axis lymph node (image 59, series 4) with SUV max equal 3.3. There is bilobed seroma at the excisional biopsy site. No  hypermetabolic supraclavicular or mediastinal lymph nodes. No suspicious pulmonary nodules. Mild bilateral apical pulmonary parenchymal lung scarring. Mild activity in the RIGHT hilum is felt to be inflammatory or vascular. ABDOMEN/PELVIS The spleen is normal size with normal metabolic activity. No abnormal metabolic activity liver. Adrenal glands kidneys are normal. Single hypermetabolic retroperitoneal lymph node posterior LEFT of aorta measures 5 mm on image 130, series 4 with SUV max equal 5.5. Small lymph node at the aortic bifurcation measuring 4 mm image 142, series 4 also has measurable metabolic activity with SUV max equal 5.1. Small LEFT inguinal lymph node measuring 6 mm short axis on image 177, series 4 has metabolic activity but to a much lesser degree within the retroperitoneal nodes (SUV max equaled 2.8). SKELETON No focal hypermetabolic activity to suggest skeletal metastasis. IMPRESSION: 1. Mild metabolic activity associated with LEFT axial lymph nodes adjacent to excisional biopsy site. 2. Two small hypermetabolic lymph nodes along the aorta. These 2 lymph nodes are less than 10 mm short axis but do have significant metabolic activity for size and are therefore concerning for lymphoma. Electronically Signed   By: Suzy Bouchard M.D.   On: 01/19/2015 11:19     ASSESSMENT & PLAN:  Diffuse large b-cell lymphoma, lymph nodes of multiple sites [facility]) I reviewed her PET CT scan with the patient and family and discussed the importance of staging bone marrow biopsy. She agreed to proceed. I will tentatively schedule her to start Chemotherapy next week. I have requested her surgeon for port placement ASAP.  Bone Marrow Biopsy and Aspiration Procedure Note   Informed consent was obtained and potential risks including bleeding, infection and pain were reviewed with the patient. The patient's name, date of birth, identification, consent and allergies were verified prior to the start of procedure  and time out was performed. The right posterior iliac crest was chosen as the site of biopsy.  The skin was prepped with Betadine solution.   8 cc of 1% lidocaine was used to provide local anaesthesia.   10 cc of bone marrow aspirate was obtained followed by 1 inch biopsy.   The procedure was tolerated well and there were no complications.  The patient was stable at the end of the procedure.  Specimens sent for flow cytometry, cytogenetics and additional studies.   All questions were answered. The patient knows to call the clinic with any problems, questions or concerns. No barriers to learning was detected. I spent 25 minutes counseling the patient face to face. The total time spent in the appointment was 30 minutes and more than 50% was on counseling and review of test results     [facility], Ajwa Kimberley, MD 01/20/2015 9:22 AM

## 2015-01-20 NOTE — Patient Instructions (Signed)
Bone Marrow Aspiration and Bone Marrow Biopsy, Care After  Refer to this sheet in the next few weeks. These instructions provide you with information about caring for yourself after your procedure. Your health care provider may also give you more specific instructions. Your treatment has been planned according to current medical practices, but problems sometimes occur. Call your health care provider if you have any problems or questions after your procedure.  WHAT TO EXPECT AFTER THE PROCEDURE  After your procedure, it is common to have:   Soreness or tenderness around the puncture site.   Bruising.  HOME CARE INSTRUCTIONS   Take medicines only as directed by your health care provider.   Follow your health care provider's instructions about:    Puncture site care.    Bandage (dressing) changes and removal.   Bathe and shower as directed by your health care provider.   Check your puncture site every day for signs of infection. Watch for:    Redness, swelling, or pain.    Fluid, blood, or pus.   Return to your normal activities as directed by your health care provider.   Keep all follow-up visits as directed by your health care provider. This is important.  SEEK MEDICAL CARE IF:   You have a fever.   You have uncontrollable bleeding.   You have redness, swelling, or pain at the site of your puncture.   You have fluid, blood, or pus coming from your puncture site.     This information is not intended to replace advice given to you by your health care provider. Make sure you discuss any questions you have with your health care provider.     Document Released: 07/16/2004 Document Revised: 05/13/2014 Document Reviewed: 12/18/2013  Elsevier Interactive Patient Education 2016 [employer]vier Inc.

## 2015-01-21 LAB — HEPATITIS B CORE ANTIBODY, IGM: HEP B C IGM: NEGATIVE

## 2015-01-21 LAB — HEPATITIS B SURFACE ANTIGEN: HBsAg Screen: NEGATIVE

## 2015-01-21 LAB — HEPATITIS B SURFACE ANTIBODY,QUALITATIVE: Hep B Surface Ab, Qual: NONREACTIVE

## 2015-01-22 ENCOUNTER — Ambulatory Visit (HOSPITAL_COMMUNITY)
Admission: RE | Admit: 2015-01-22 | Discharge: 2015-01-22 | Disposition: A | Discharge status: Home / Self Care | Payer: Medicare Other | Source: Ambulatory Visit | Attending: Hematology and Oncology | Admitting: Hematology and Oncology

## 2015-01-22 ENCOUNTER — Ambulatory Visit (HOSPITAL_BASED_OUTPATIENT_CLINIC_OR_DEPARTMENT_OTHER): Payer: Medicare Other | Admitting: Hematology and Oncology

## 2015-01-22 ENCOUNTER — Encounter: Payer: Self-pay | Admitting: General Practice

## 2015-01-22 ENCOUNTER — Other Ambulatory Visit: Payer: Medicare Other

## 2015-01-22 ENCOUNTER — Encounter: Payer: Self-pay | Admitting: *Deleted

## 2015-01-22 ENCOUNTER — Telehealth: Payer: Self-pay | Admitting: Hematology and Oncology

## 2015-01-22 VITALS — BP 121/75 | HR 107 | Temp 98.9°F | Resp 18 | Ht 65.0 in | Wt 142.3 lb

## 2015-01-22 DIAGNOSIS — C8334 Diffuse large B-cell lymphoma, lymph nodes of axilla and upper limb: Secondary | ICD-10-CM | POA: Diagnosis not present

## 2015-01-22 DIAGNOSIS — C8338 Diffuse large B-cell lymphoma, lymph nodes of multiple sites: Secondary | ICD-10-CM

## 2015-01-22 MED ORDER — ALLOPURINOL 300 MG PO TABS: 300.0000 mg | ORAL_TABLET | Freq: Every day | ORAL | Status: DC

## 2015-01-22 MED ORDER — PREDNISONE 20 MG PO TABS: 60.0000 mg | ORAL_TABLET | Freq: Every day | ORAL | Status: DC

## 2015-01-22 MED ORDER — ONDANSETRON HCL 8 MG PO TABS: 8.0000 mg | ORAL_TABLET | Freq: Three times a day (TID) | ORAL | Status: DC | PRN

## 2015-01-22 MED ORDER — PROCHLORPERAZINE MALEATE 10 MG PO TABS: 10.0000 mg | ORAL_TABLET | Freq: Four times a day (QID) | ORAL | Status: DC | PRN

## 2015-01-22 MED ORDER — LIDOCAINE-PRILOCAINE 2.5-2.5 % EX CREA: TOPICAL_CREAM | CUTANEOUS | Status: DC

## 2015-01-22 NOTE — Telephone Encounter (Signed)
Scheduled patient per pof, avs report not able to printed due to d/c.

## 2015-01-22 NOTE — Progress Notes (Signed)
  Echocardiogram 2D Echocardiogram has been performed.  Tresa Res 01/22/2015, 9:27 AM

## 2015-01-22 NOTE — Progress Notes (Signed)
Spiritual Care Note  Maria Perez, husband, and daughter [name] (who lives near [place]) in chemo class today.  [name] was the only pt, affording [place] opportunity for more detailed conversation.  She has another daughter who will also be part of her support team.  Answered family's questions about [place].  Family aware of ongoing Support Team availability as desired.  [employer] [place], [place], [facility] Pager [phone] Voicemail  [phone]

## 2015-01-23 NOTE — Progress Notes (Signed)
[place] OFFICE PROGRESS NOTE  Patient Care Team: Signe Colt, MD as PCP - General (Obstetrics and Gynecology) Coralie Keens, MD as Consulting Physician (General Surgery)  SUMMARY OF ONCOLOGIC HISTORY:   Diffuse large b-cell lymphoma, lymph nodes of multiple sites [facility])   01/07/2015 Procedure She had excisional biopsy of left axilla   01/07/2015 Pathology Results Accession: [id number] showed follicular and DLBCL   [id number] Imaging PET scan showed stage III disease   01/22/2015 Imaging [facility] showed normal EF    INTERVAL HISTORY: Please see below for problem oriented charting. She returns for further follow-up. She denies new symptoms.  REVIEW OF SYSTEMS:   Constitutional: Denies fevers, chills or abnormal weight loss Eyes: Denies blurriness of vision Ears, nose, mouth, throat, and face: Denies mucositis or sore throat Respiratory: Denies cough, dyspnea or wheezes Cardiovascular: Denies palpitation, chest discomfort or lower extremity swelling Gastrointestinal:  Denies nausea, heartburn or change in bowel habits Skin: Denies abnormal skin rashes Lymphatics: Denies new lymphadenopathy or easy bruising Neurological:Denies numbness, tingling or new weaknesses Behavioral/Psych: Mood is stable, no new changes  All other systems were reviewed with the patient and are negative.  I have reviewed the past medical history, past surgical history, social history and family history with the patient and they are unchanged from previous note.  ALLERGIES:  has No Known Allergies.  MEDICATIONS:  Current Outpatient Prescriptions  Medication Sig Dispense Refill  . allopurinol (ZYLOPRIM) 300 MG tablet Take 1 tablet (300 mg total) by mouth daily. 30 tablet 0  . calcium carbonate (OSCAL) 1500 (600 CA) MG TABS tablet Take by mouth 2 (two) times daily with a meal.    . cetirizine (ZYRTEC) 10 MG tablet Take 10 mg by mouth daily.    Marland Kitchen lidocaine-prilocaine (EMLA) cream Apply to  affected area once 30 g 3  . ondansetron (ZOFRAN) 8 MG tablet Take 1 tablet (8 mg total) by mouth every 8 (eight) hours as needed. 30 tablet 1  . predniSONE (DELTASONE) 20 MG tablet Take 3 tablets (60 mg total) by mouth daily. Take on days 2-5 of chemotherapy every 3 weeks 60 tablet 6  . prochlorperazine (COMPAZINE) 10 MG tablet Take 1 tablet (10 mg total) by mouth every 6 (six) hours as needed (Nausea or vomiting). 30 tablet 6   No current facility-administered medications for this visit.    PHYSICAL EXAMINATION: ECOG PERFORMANCE STATUS: 0 - Asymptomatic  Filed Vitals:   01/22/15 1259  BP: 121/75  Pulse: 107  Temp: 98.9 F (37.2 C)  Resp: 18   Filed Weights   01/22/15 1259  Weight: 142 lb 4.8 oz (64.547 kg)    GENERAL:alert, no distress and comfortable SKIN: skin color, texture, turgor are normal, no rashes or significant lesions EYES: normal, Conjunctiva are pink and non-injected, sclera clear Musculoskeletal:no cyanosis of digits and no clubbing  NEURO: alert & oriented x 3 with fluent speech, no focal motor/sensory deficits  LABORATORY DATA:  I have reviewed the data as listed    Component Value Date/Time   NA 139 01/20/2015 0849   K 4.1 01/20/2015 0849   CO2 25 01/20/2015 0849   GLUCOSE 100 01/20/2015 0849   BUN 21.6 01/20/2015 0849   CREATININE 0.8 01/20/2015 0849   CALCIUM 9.3 01/20/2015 0849   PROT 6.8 01/20/2015 0849   ALBUMIN 4.1 01/20/2015 0849   AST 21 01/20/2015 0849   ALT 19 01/20/2015 0849   ALKPHOS 102 01/20/2015 0849   BILITOT 0.62 01/20/2015 0849  No results found for: SPEP, UPEP  Lab Results  Component Value Date   WBC 4.8 01/20/2015   NEUTROABS 3.2 01/20/2015   HGB 14.2 01/20/2015   HCT 43.9 01/20/2015   MCV 88.4 01/20/2015   PLT 266 01/20/2015      Chemistry      Component Value Date/Time   NA 139 01/20/2015 0849   K 4.1 01/20/2015 0849   CO2 25 01/20/2015 0849   BUN 21.6 01/20/2015 0849   CREATININE 0.8 01/20/2015 0849       Component Value Date/Time   CALCIUM 9.3 01/20/2015 0849   ALKPHOS 102 01/20/2015 0849   AST 21 01/20/2015 0849   ALT 19 01/20/2015 0849   BILITOT 0.62 01/20/2015 0849       RADIOGRAPHIC STUDIES:I reviewed the PET scan with patient I have personally reviewed the radiological images as listed and agreed with the findings in the report.    ASSESSMENT & PLAN:  Diffuse large b-cell lymphoma, lymph nodes of multiple sites [facility]) I reviewed with the patient and family extensively about pathophysiology and behavior of follicular lymphoma and diffuse large B-cell lymphoma. It is likely that she has stage IV follicular lymphoma but only stage 3 diffuse large B-cell lymphoma. She understood the importance of treating the aggressive lymphoma with aggressive chemotherapy. I plan 6 cycles of R CHOP chemotherapy. We will arrange for port placement. She will take allopurinol for tumor lysis prophylaxis. After completing 6 cycles of chemotherapy, due to extensive stage IV follicular lymphoma, she would benefit from maintenance rituximab in the future. We discussed the role of chemotherapy. The intent is for cure. The decision was made based on publication at the [facility]. It is a category 1 recommendation from NCCN.  CHOP Chemotherapy plus Rituximab Compared with CHOP Alone in Elderly Patients with Diffuse Large-B-Cell Lymphoma Jannet Askew, M.D., Rexene Edison, M.D., Ph.D., Farley Ly, M.D., Crawford Givens, M.D., Knox Royalty, M.D., Ricky Ala, M.D., Doree Fudge, M.D., Eppie Gibson Richarda Blade, M.D., Simona Huh, M.D., Ph.D., Jolene Schimke, M.D., Bishop Limbo, M.D., Evelene Croon, Evelene Croon, Ph.D., and Sallyanne Kuster, M.D. Alison Stalling J Med 2002; 346:235-242January 24, 2002DOI: 10.1056/[id number]  The rate of complete response was significantly higher in the group that received CHOP plus rituximab than in the group that received CHOP alone (76 percent vs. 63 percent, P=0.005).  With a median follow-up of two years, event-free and overall survival times were significantly higher in the CHOP-plus-rituximab group (P<0.001 and P=0.007, respectively). The addition of rituximab to standard CHOP chemotherapy significantly reduced the risk of treatment failure and death (risk ratios, 0.58 [95 percent confidence interval, 0.44 to 0.77] and 0.64 [0.45 to 0.89], respectively). Clinically relevant toxicity was not significantly greater with CHOP plus rituximab.  We discussed some of the risks, benefits and side-effects of Rituximab,Cytoxan, Adriamycin,Vincristine and Solumedrol/Prednisone.   Some of the short term side-effects included, though not limited to, risk of fatigue, weight loss, tumor lysis syndrome, risk of allergic reactions, pancytopenia, life-threatening infections, need for transfusions of blood products, nausea, vomiting, change in bowel habits, hair loss, risk of congestive heart failure, admission to hospital for various reasons, and risks of death.   Long term side-effects are also discussed including permanent damage to nerve function, chronic fatigue, and rare secondary malignancy including bone marrow disorders.   The patient is aware that the response rates discussed earlier is not guaranteed.    After a long discussion, patient made an informed decision to proceed with the prescribed plan of care  Orders Placed This Encounter  Procedures  . CBC with Differential    Standing Status: Standing     Number of Occurrences: 20     Standing Expiration Date: 01/23/2016  . Comprehensive metabolic panel    Standing Status: Standing     Number of Occurrences: 20     Standing Expiration Date: 01/23/2016  . PHYSICIAN COMMUNICATION ORDER    A baseline Echo/ Muga should be obtained prior to initiation of Anthracycline Chemotherapy  . PHYSICIAN COMMUNICATION ORDER    Hepatitis B Virus screening with HBsAg and anti-HBc recommended prior to treatment with rituximab  (Rituxan), ofatumumab (Arzerra) or obinutuzumab Dyann Kief).   All questions were answered. The patient knows to call the clinic with any problems, questions or concerns. No barriers to learning was detected. I spent 40 minutes counseling the patient face to face. The total time spent in the appointment was 60 minutes and more than 50% was on counseling and review of test results     [facility], [place], MD 01/23/2015 10:56 AM

## 2015-01-23 NOTE — Assessment & Plan Note (Signed)
I reviewed with the patient and family extensively about pathophysiology and behavior of follicular lymphoma and diffuse large B-cell lymphoma. It is likely that she has stage IV follicular lymphoma but only stage 3 diffuse large B-cell lymphoma. She understood the importance of treating the aggressive lymphoma with aggressive chemotherapy. I plan 6 cycles of R CHOP chemotherapy. We will arrange for port placement. She will take allopurinol for tumor lysis prophylaxis. After completing 6 cycles of chemotherapy, due to extensive stage IV follicular lymphoma, she would benefit from maintenance rituximab in the future. We discussed the role of chemotherapy. The intent is for cure. The decision was made based on publication at the [facility]. It is a category 1 recommendation from NCCN.  CHOP Chemotherapy plus Rituximab Compared with CHOP Alone in Elderly Patients with Diffuse Large-B-Cell Lymphoma Jannet Askew, M.D., Rexene Edison, M.D., Ph.D., Farley Ly, M.D., Crawford Givens, M.D., Knox Royalty, M.D., Ricky Ala, M.D., Doree Fudge, M.D., Eppie Gibson Richarda Blade, M.D., Simona Huh, M.D., Ph.D., Jolene Schimke, M.D., Bishop Limbo, M.D., Evelene Croon, Evelene Croon, Ph.D., and Sallyanne Kuster, M.D. Alison Stalling J Med 2002; 346:235-242January 24, 2002DOI: 10.1056/[id number]  The rate of complete response was significantly higher in the group that received CHOP plus rituximab than in the group that received CHOP alone (76 percent vs. 63 percent, P=0.005). With a median follow-up of two years, event-free and overall survival times were significantly higher in the CHOP-plus-rituximab group (P<0.001 and P=0.007, respectively). The addition of rituximab to standard CHOP chemotherapy significantly reduced the risk of treatment failure and death (risk ratios, 0.58 [95 percent confidence interval, 0.44 to 0.77] and 0.64 [0.45 to 0.89], respectively). Clinically relevant toxicity was not significantly  greater with CHOP plus rituximab.  We discussed some of the risks, benefits and side-effects of Rituximab,Cytoxan, Adriamycin,Vincristine and Solumedrol/Prednisone.   Some of the short term side-effects included, though not limited to, risk of fatigue, weight loss, tumor lysis syndrome, risk of allergic reactions, pancytopenia, life-threatening infections, need for transfusions of blood products, nausea, vomiting, change in bowel habits, hair loss, risk of congestive heart failure, admission to hospital for various reasons, and risks of death.   Long term side-effects are also discussed including permanent damage to nerve function, chronic fatigue, and rare secondary malignancy including bone marrow disorders.   The patient is aware that the response rates discussed earlier is not guaranteed.    After a long discussion, patient made an informed decision to proceed with the prescribed plan of care

## 2015-01-27 ENCOUNTER — Telehealth: Payer: Self-pay | Admitting: Hematology and Oncology

## 2015-01-27 ENCOUNTER — Other Ambulatory Visit: Payer: Self-pay

## 2015-01-27 ENCOUNTER — Other Ambulatory Visit: Payer: Self-pay | Admitting: Hematology and Oncology

## 2015-01-27 ENCOUNTER — Ambulatory Visit (HOSPITAL_BASED_OUTPATIENT_CLINIC_OR_DEPARTMENT_OTHER): Payer: Medicare Other

## 2015-01-27 ENCOUNTER — Encounter: Payer: Self-pay | Admitting: Hematology and Oncology

## 2015-01-27 VITALS — BP 105/64 | HR 87 | Temp 98.0°F | Resp 16

## 2015-01-27 DIAGNOSIS — Z5112 Encounter for antineoplastic immunotherapy: Secondary | ICD-10-CM

## 2015-01-27 DIAGNOSIS — Z5111 Encounter for antineoplastic chemotherapy: Secondary | ICD-10-CM

## 2015-01-27 DIAGNOSIS — C8338 Diffuse large B-cell lymphoma, lymph nodes of multiple sites: Secondary | ICD-10-CM

## 2015-01-27 MED ORDER — SODIUM CHLORIDE 0.9 % IV SOLN
Freq: Once | INTRAVENOUS | Status: AC
  Administered 2015-01-27: 10:00:00 via INTRAVENOUS

## 2015-01-27 MED ORDER — SODIUM CHLORIDE 0.9 % IV SOLN
Freq: Once | INTRAVENOUS | Status: AC
  Administered 2015-01-27: 10:00:00 via INTRAVENOUS
  Filled 2015-01-27: qty 8

## 2015-01-27 MED ORDER — VINCRISTINE SULFATE CHEMO INJECTION 1 MG/ML
2.0000 mg | Freq: Once | INTRAVENOUS | Status: AC
  Administered 2015-01-27: 2 mg via INTRAVENOUS
  Filled 2015-01-27: qty 2

## 2015-01-27 MED ORDER — ACETAMINOPHEN 325 MG PO TABS
ORAL_TABLET | ORAL | Status: AC
  Filled 2015-01-27: qty 2

## 2015-01-27 MED ORDER — DIPHENHYDRAMINE HCL 25 MG PO CAPS
ORAL_CAPSULE | ORAL | Status: AC
  Filled 2015-01-27: qty 2

## 2015-01-27 MED ORDER — DOXORUBICIN HCL CHEMO IV INJECTION 2 MG/ML
50.0000 mg/m2 | Freq: Once | INTRAVENOUS | Status: AC
Start: 2015-01-27 — End: 2015-01-27
  Administered 2015-01-27: 86 mg via INTRAVENOUS
  Filled 2015-01-27: qty 43

## 2015-01-27 MED ORDER — SODIUM CHLORIDE 0.9 % IV SOLN
750.0000 mg/m2 | Freq: Once | INTRAVENOUS | Status: AC
  Administered 2015-01-27: 1280 mg via INTRAVENOUS
  Filled 2015-01-27: qty 64

## 2015-01-27 MED ORDER — DIPHENHYDRAMINE HCL 25 MG PO CAPS
50.0000 mg | ORAL_CAPSULE | Freq: Once | ORAL | Status: AC
  Administered 2015-01-27: 50 mg via ORAL

## 2015-01-27 MED ORDER — SODIUM CHLORIDE 0.9 % IV SOLN
375.0000 mg/m2 | Freq: Once | INTRAVENOUS | Status: AC
  Administered 2015-01-27: 600 mg via INTRAVENOUS
  Filled 2015-01-27: qty 50

## 2015-01-27 MED ORDER — ACETAMINOPHEN 325 MG PO TABS
650.0000 mg | ORAL_TABLET | Freq: Once | ORAL | Status: AC
Start: 2015-01-27 — End: 2015-01-27
  Administered 2015-01-27: 650 mg via ORAL

## 2015-01-27 NOTE — Patient Instructions (Signed)
[facility] Discharge Instructions for Patients Receiving Chemotherapy  Today you received the following chemotherapy agents Rituxan, Cytoxan, Adriamycin, Vincristine  To help prevent nausea and vomiting after your treatment, we encourage you to take your nausea medication    If you develop nausea and vomiting that is not controlled by your nausea medication, call the clinic.   BELOW ARE SYMPTOMS THAT SHOULD BE REPORTED IMMEDIATELY:  *FEVER GREATER THAN 100.5 F  *CHILLS WITH OR WITHOUT FEVER  NAUSEA AND VOMITING THAT IS NOT CONTROLLED WITH YOUR NAUSEA MEDICATION  *UNUSUAL SHORTNESS OF BREATH  *UNUSUAL BRUISING OR BLEEDING  TENDERNESS IN MOUTH AND THROAT WITH OR WITHOUT PRESENCE OF ULCERS  *URINARY PROBLEMS  *BOWEL PROBLEMS  UNUSUAL RASH Items with * indicate a potential emergency and should be followed up as soon as possible.  Feel free to call the clinic you have any questions or concerns. The clinic phone number is [phone].  Please show the CHEMO ALERT CARD at check-in to the Emergency Department and triage nurse.   

## 2015-01-27 NOTE — Telephone Encounter (Signed)
inj added per pof and patient will get this on new pof at chemo today

## 2015-01-27 NOTE — Progress Notes (Signed)
Applied to [employer] for copay assistance.  Faxed all the needed documentation today.  If pt is approved they will notify her by mail.  I will check the provider portal for approval.  If approved I will request a copy of the approval letter and proof of expenditure form to send to the billing department.

## 2015-01-29 ENCOUNTER — Ambulatory Visit (HOSPITAL_BASED_OUTPATIENT_CLINIC_OR_DEPARTMENT_OTHER): Payer: Medicare Other

## 2015-01-29 VITALS — BP 107/57 | HR 79 | Temp 98.7°F

## 2015-01-29 DIAGNOSIS — Z5189 Encounter for other specified aftercare: Secondary | ICD-10-CM | POA: Diagnosis not present

## 2015-01-29 DIAGNOSIS — C8338 Diffuse large B-cell lymphoma, lymph nodes of multiple sites: Secondary | ICD-10-CM | POA: Diagnosis not present

## 2015-01-29 MED ORDER — PEGFILGRASTIM INJECTION 6 MG/0.6ML ~~LOC~~
6.0000 mg | PREFILLED_SYRINGE | Freq: Once | SUBCUTANEOUS | Status: AC
Start: 2015-01-29 — End: 2015-01-29
  Administered 2015-01-29: 6 mg via SUBCUTANEOUS
  Filled 2015-01-29: qty 0.6

## 2015-01-29 NOTE — Patient Instructions (Signed)
Pegfilgrastim injection What is this medicine? PEGFILGRASTIM (PEG fil gra stim) is a long-acting granulocyte colony-stimulating factor that stimulates the growth of neutrophils, a type of white blood cell important in the body's fight against infection. It is used to reduce the incidence of fever and infection in patients with certain types of cancer who are receiving chemotherapy that affects the bone marrow, and to increase survival after being exposed to high doses of radiation. This medicine may be used for other purposes; ask your health care provider or pharmacist if you have questions. What should I tell my health care provider before I take this medicine? They need to know if you have any of these conditions: -kidney disease -latex allergy -ongoing radiation therapy -sickle cell disease -skin reactions to acrylic adhesives (On-Body Injector only) -an unusual or allergic reaction to pegfilgrastim, filgrastim, other medicines, foods, dyes, or preservatives -pregnant or trying to get pregnant -breast-feeding How should I use this medicine? This medicine is for injection under the skin. If you get this medicine at home, you will be taught how to prepare and give the pre-filled syringe or how to use the On-body Injector. Refer to the patient Instructions for Use for detailed instructions. Use exactly as directed. Take your medicine at regular intervals. Do not take your medicine more often than directed. It is important that you put your used needles and syringes in a special sharps container. Do not put them in a trash can. If you do not have a sharps container, call your pharmacist or healthcare provider to get one. Talk to your pediatrician regarding the use of this medicine in children. While this drug may be prescribed for selected conditions, precautions do apply. Overdosage: If you think you have taken too much of this medicine contact a poison control center or emergency room at  once. NOTE: This medicine is only for you. Do not share this medicine with others. What if I miss a dose? It is important not to miss your dose. Call your doctor or health care professional if you miss your dose. If you miss a dose due to an On-body Injector failure or leakage, a new dose should be administered as soon as possible using a single prefilled syringe for manual use. What may interact with this medicine? Interactions have not been studied. Give your health care provider a list of all the medicines, herbs, non-prescription drugs, or dietary supplements you use. Also tell them if you smoke, drink alcohol, or use illegal drugs. Some items may interact with your medicine. This list may not describe all possible interactions. Give your health care provider a list of all the medicines, herbs, non-prescription drugs, or dietary supplements you use. Also tell them if you smoke, drink alcohol, or use illegal drugs. Some items may interact with your medicine. What should I watch for while using this medicine? You may need blood work done while you are taking this medicine. If you are going to need a MRI, CT scan, or other procedure, tell your doctor that you are using this medicine (On-Body Injector only). What side effects may I notice from receiving this medicine? Side effects that you should report to your doctor or health care professional as soon as possible: -allergic reactions like skin rash, itching or hives, swelling of the face, lips, or tongue -dizziness -fever -pain, redness, or irritation at site where injected -pinpoint red spots on the skin -red or dark-brown urine -shortness of breath or breathing problems -stomach or side pain, or pain   at the shoulder -swelling -tiredness -trouble passing urine or change in the amount of urine Side effects that usually do not require medical attention (report to your doctor or health care professional if they continue or are  bothersome): -bone pain -muscle pain This list may not describe all possible side effects. Call your doctor for medical advice about side effects. You may report side effects to FDA at 1-800-FDA-1088. Where should I keep my medicine? Keep out of the reach of children. Store pre-filled syringes in a refrigerator between 2 and 8 degrees C (36 and 46 degrees F). Do not freeze. Keep in carton to protect from light. Throw away this medicine if it is left out of the refrigerator for more than 48 hours. Throw away any unused medicine after the expiration date. NOTE: This sheet is a summary. It may not cover all possible information. If you have questions about this medicine, talk to your doctor, pharmacist, or health care provider.    2016, Elsevier/Gold Standard. (2014-01-16 14:30:14)  

## 2015-01-29 NOTE — Progress Notes (Signed)
Maria Perez here for Neulasta injection.  States that she is doing well.  Has had slight nausea at times.  Instructed her to use her antiemetics as needed. She is drinking and eating well

## 2015-01-30 LAB — CHROMOSOME ANALYSIS, BONE MARROW

## 2015-01-30 LAB — TISSUE HYBRIDIZATION (BONE MARROW)-NCBH

## 2015-02-03 ENCOUNTER — Encounter: Payer: Self-pay | Admitting: Hematology and Oncology

## 2015-02-03 NOTE — Progress Notes (Signed)
Pt is approved w/ [employer] to receive assistance up to $2,500 to use for ins premiums and/or eligible pharmaceutical copays prescribed in relation to her Dx effective 01/11/15 to 01/10/16.  Will send copy of approval letter and POE to Raquel Sarna in the billing dept.

## 2015-02-04 ENCOUNTER — Encounter: Payer: Self-pay | Admitting: *Deleted

## 2015-02-04 NOTE — Progress Notes (Signed)
Co-pay assistance form from [employer] signed by Dr. Alvy Bimler and given to Raquel in managed care dept.Marland Kitchen

## 2015-02-09 ENCOUNTER — Encounter (HOSPITAL_BASED_OUTPATIENT_CLINIC_OR_DEPARTMENT_OTHER): Payer: Self-pay | Admitting: *Deleted

## 2015-02-11 NOTE — H&P (Signed)
Maria Perez is an 75 y.o. female.   Chief Complaint: lymphoma HPI: she presents for port-a-cath insertion for planned IV chemotherapy for treatment of lymphoma.  She is without complaints  Past Medical History  Diagnosis Date  . Diffuse large B cell lymphoma ([place]) 01/16/2015  . Diffuse large b-cell lymphoma, lymph nodes of multiple sites ([place]) 01/16/2015    Past Surgical History  Procedure Laterality Date  . Tonsillectomy    . Axillary lymph node biopsy Left 01/07/2015    Procedure: EXCISIONAL LEFT AXILLARY LYMPH NODE BIOPSY;  Surgeon: Coralie Keens, MD;  Location: [facility];  Service: General;  Laterality: Left;    Family History  Problem Relation Age of Onset  . Emphysema Father    Social History:  reports that she has never smoked. She has never used smokeless tobacco. She reports that she does not drink alcohol or use illicit drugs.  Allergies: No Known Allergies  No prescriptions prior to admission    No results found for this or any previous visit (from the past 48 hour(s)). No results found.  Review of Systems  All other systems reviewed and are negative.   Height 5\' 5"  (1.651 m), weight 67.132 kg (148 lb). Physical Exam  Constitutional: She is oriented to person, place, and time. She appears well-developed and well-nourished. No distress.  HENT:  Head: Normocephalic and atraumatic.  Right Ear: External ear normal.  Left Ear: External ear normal.  Nose: Nose normal.  Eyes: Conjunctivae are normal. Pupils are equal, round, and reactive to light.  Neck: Normal range of motion. Neck supple. No tracheal deviation present.  Cardiovascular: Normal rate, regular rhythm and normal heart sounds.   Respiratory: Breath sounds normal. No respiratory distress. She has no wheezes.  GI: Bowel sounds are normal. She exhibits no distension. There is no tenderness.  Musculoskeletal: Normal range of motion. She exhibits no edema or tenderness.  Neurological: She  is alert and oriented to person, place, and time.  Skin: Skin is warm and dry. No erythema.  Psychiatric: Her behavior is normal.     Assessment/Plan Lymphoma with need for central venous access for chemotherapy  I discussed the procedure with her in detail.  I discussed the risks which include but are not limited to bleeding, infection, pneumothorax, etc.  She agrees to proceed.  Harl Bowie, MD 02/11/2015, 6:57 PM

## 2015-02-12 ENCOUNTER — Encounter (HOSPITAL_BASED_OUTPATIENT_CLINIC_OR_DEPARTMENT_OTHER): Payer: Self-pay | Admitting: Anesthesiology

## 2015-02-12 ENCOUNTER — Ambulatory Visit (HOSPITAL_BASED_OUTPATIENT_CLINIC_OR_DEPARTMENT_OTHER)
Admission: RE | Admit: 2015-02-12 | Discharge: 2015-02-12 | Disposition: A | Discharge status: Home / Self Care | Payer: Medicare Other | Source: Ambulatory Visit | Attending: Surgery | Admitting: Surgery

## 2015-02-12 ENCOUNTER — Ambulatory Visit (HOSPITAL_BASED_OUTPATIENT_CLINIC_OR_DEPARTMENT_OTHER): Payer: Medicare Other | Admitting: Anesthesiology

## 2015-02-12 ENCOUNTER — Ambulatory Visit (HOSPITAL_COMMUNITY): Payer: Medicare Other

## 2015-02-12 ENCOUNTER — Encounter (HOSPITAL_BASED_OUTPATIENT_CLINIC_OR_DEPARTMENT_OTHER)
Admission: RE | Disposition: A | Discharge status: Home / Self Care | Payer: Self-pay | Source: Ambulatory Visit | Attending: Surgery

## 2015-02-12 DIAGNOSIS — Z452 Encounter for adjustment and management of vascular access device: Secondary | ICD-10-CM | POA: Diagnosis not present

## 2015-02-12 DIAGNOSIS — Z419 Encounter for procedure for purposes other than remedying health state, unspecified: Secondary | ICD-10-CM

## 2015-02-12 DIAGNOSIS — C8334 Diffuse large B-cell lymphoma, lymph nodes of axilla and upper limb: Secondary | ICD-10-CM | POA: Diagnosis not present

## 2015-02-12 DIAGNOSIS — Z95828 Presence of other vascular implants and grafts: Secondary | ICD-10-CM

## 2015-02-12 DIAGNOSIS — C8338 Diffuse large B-cell lymphoma, lymph nodes of multiple sites: Secondary | ICD-10-CM | POA: Insufficient documentation

## 2015-02-12 DIAGNOSIS — C8590 Non-Hodgkin lymphoma, unspecified, unspecified site: Secondary | ICD-10-CM | POA: Diagnosis not present

## 2015-02-12 DIAGNOSIS — Z79899 Other long term (current) drug therapy: Secondary | ICD-10-CM | POA: Diagnosis not present

## 2015-02-12 HISTORY — PX: PORTACATH PLACEMENT: SHX2246

## 2015-02-12 SURGERY — INSERTION, TUNNELED CENTRAL VENOUS DEVICE, WITH PORT
Anesthesia: General | Site: Chest | Laterality: Left

## 2015-02-12 MED ORDER — CEFAZOLIN SODIUM-DEXTROSE 2-3 GM-% IV SOLR
2.0000 g | INTRAVENOUS | Status: AC
  Administered 2015-02-12: 2 g via INTRAVENOUS

## 2015-02-12 MED ORDER — PHENYLEPHRINE HCL 10 MG/ML IJ SOLN
INTRAMUSCULAR | Status: AC
  Filled 2015-02-12: qty 1

## 2015-02-12 MED ORDER — MIDAZOLAM HCL 2 MG/2ML IJ SOLN: 1.0000 mg | INTRAMUSCULAR | Status: DC | PRN

## 2015-02-12 MED ORDER — FENTANYL CITRATE (PF) 100 MCG/2ML IJ SOLN
INTRAMUSCULAR | Status: DC | PRN
Start: 2015-02-12 — End: 2015-02-12
  Administered 2015-02-12: 100 ug via INTRAVENOUS

## 2015-02-12 MED ORDER — MORPHINE SULFATE (PF) 2 MG/ML IV SOLN: 1.0000 mg | INTRAVENOUS | Status: DC | PRN

## 2015-02-12 MED ORDER — FENTANYL CITRATE (PF) 100 MCG/2ML IJ SOLN
25.0000 ug | INTRAMUSCULAR | Status: DC | PRN
Start: 2015-02-12 — End: 2015-02-12
  Administered 2015-02-12: 25 ug via INTRAVENOUS

## 2015-02-12 MED ORDER — LIDOCAINE HCL (CARDIAC) 20 MG/ML IV SOLN
INTRAVENOUS | Status: AC
  Filled 2015-02-12: qty 5

## 2015-02-12 MED ORDER — HEPARIN (PORCINE) IN NACL 2-0.9 UNIT/ML-% IJ SOLN
INTRAMUSCULAR | Status: DC | PRN
Start: 2015-02-12 — End: 2015-02-12
  Administered 2015-02-12: 500 mL via INTRAVENOUS

## 2015-02-12 MED ORDER — ACETAMINOPHEN 650 MG RE SUPP: 650.0000 mg | RECTAL | Status: DC | PRN

## 2015-02-12 MED ORDER — FENTANYL CITRATE (PF) 100 MCG/2ML IJ SOLN
INTRAMUSCULAR | Status: AC
  Filled 2015-02-12: qty 2

## 2015-02-12 MED ORDER — ONDANSETRON HCL 4 MG/2ML IJ SOLN
INTRAMUSCULAR | Status: AC
  Filled 2015-02-12: qty 4

## 2015-02-12 MED ORDER — FENTANYL CITRATE (PF) 100 MCG/2ML IJ SOLN: 50.0000 ug | INTRAMUSCULAR | Status: DC | PRN

## 2015-02-12 MED ORDER — HYDROCODONE-ACETAMINOPHEN 5-325 MG PO TABS: 1.0000 | ORAL_TABLET | Freq: Once | ORAL | Status: DC | PRN

## 2015-02-12 MED ORDER — BUPIVACAINE-EPINEPHRINE 0.5% -1:200000 IJ SOLN
INTRAMUSCULAR | Status: DC | PRN
  Administered 2015-02-12: 10 mL

## 2015-02-12 MED ORDER — OXYCODONE HCL 5 MG PO TABS
ORAL_TABLET | ORAL | Status: AC
  Filled 2015-02-12: qty 1

## 2015-02-12 MED ORDER — SCOPOLAMINE 1 MG/3DAYS TD PT72: 1.0000 | MEDICATED_PATCH | Freq: Once | TRANSDERMAL | Status: DC | PRN

## 2015-02-12 MED ORDER — OXYCODONE HCL 5 MG PO TABS
5.0000 mg | ORAL_TABLET | ORAL | Status: DC | PRN
  Administered 2015-02-12: 5 mg via ORAL

## 2015-02-12 MED ORDER — GLYCOPYRROLATE 0.2 MG/ML IJ SOLN: 0.2000 mg | Freq: Once | INTRAMUSCULAR | Status: DC | PRN

## 2015-02-12 MED ORDER — CEFAZOLIN SODIUM-DEXTROSE 2-3 GM-% IV SOLR
INTRAVENOUS | Status: AC
  Filled 2015-02-12: qty 50

## 2015-02-12 MED ORDER — DEXAMETHASONE SODIUM PHOSPHATE 4 MG/ML IJ SOLN
INTRAMUSCULAR | Status: DC | PRN
  Administered 2015-02-12: 10 mg via INTRAVENOUS

## 2015-02-12 MED ORDER — SODIUM CHLORIDE 0.9% FLUSH: 3.0000 mL | Freq: Two times a day (BID) | INTRAVENOUS | Status: DC

## 2015-02-12 MED ORDER — PROPOFOL 10 MG/ML IV BOLUS
INTRAVENOUS | Status: AC
  Filled 2015-02-12: qty 40

## 2015-02-12 MED ORDER — PROMETHAZINE HCL 25 MG/ML IJ SOLN: 6.2500 mg | INTRAMUSCULAR | Status: DC | PRN

## 2015-02-12 MED ORDER — SODIUM CHLORIDE 0.9 % IV SOLN: 250.0000 mL | INTRAVENOUS | Status: DC | PRN

## 2015-02-12 MED ORDER — LACTATED RINGERS IV SOLN
INTRAVENOUS | Status: DC
  Administered 2015-02-12 (×2): via INTRAVENOUS

## 2015-02-12 MED ORDER — HEPARIN SOD (PORK) LOCK FLUSH 100 UNIT/ML IV SOLN
INTRAVENOUS | Status: DC | PRN
  Administered 2015-02-12: 500 [IU] via INTRAVENOUS

## 2015-02-12 MED ORDER — PROPOFOL 10 MG/ML IV BOLUS
INTRAVENOUS | Status: DC | PRN
  Administered 2015-02-12 (×2): 100 mg via INTRAVENOUS
  Administered 2015-02-12: 50 mg via INTRAVENOUS
  Administered 2015-02-12: 130 mg via INTRAVENOUS

## 2015-02-12 MED ORDER — PHENYLEPHRINE HCL 10 MG/ML IJ SOLN
INTRAMUSCULAR | Status: DC | PRN
  Administered 2015-02-12: 80 ug via INTRAVENOUS

## 2015-02-12 MED ORDER — ACETAMINOPHEN 325 MG PO TABS: 650.0000 mg | ORAL_TABLET | ORAL | Status: DC | PRN

## 2015-02-12 MED ORDER — LIDOCAINE HCL (CARDIAC) 20 MG/ML IV SOLN
INTRAVENOUS | Status: DC | PRN
  Administered 2015-02-12: 50 mg via INTRAVENOUS

## 2015-02-12 MED ORDER — SODIUM CHLORIDE 0.9% FLUSH: 3.0000 mL | INTRAVENOUS | Status: DC | PRN

## 2015-02-12 SURGICAL SUPPLY — 41 items
BAG DECANTER FOR FLEXI CONT (MISCELLANEOUS) ×3 IMPLANT
BLADE HEX COATED 2.75 (ELECTRODE) ×3 IMPLANT
BLADE SURG 15 STRL LF DISP TIS (BLADE) ×1 IMPLANT
BLADE SURG 15 STRL SS (BLADE) ×2
CANISTER SUCT 1200ML W/VALVE (MISCELLANEOUS) ×3 IMPLANT
CHLORAPREP W/TINT 26ML (MISCELLANEOUS) ×3 IMPLANT
COVER BACK TABLE 60X90IN (DRAPES) ×3 IMPLANT
COVER MAYO STAND STRL (DRAPES) ×3 IMPLANT
DECANTER SPIKE VIAL GLASS SM (MISCELLANEOUS) ×3 IMPLANT
DRAPE C-ARM 42X72 X-RAY (DRAPES) ×3 IMPLANT
DRAPE LAPAROSCOPIC ABDOMINAL (DRAPES) ×3 IMPLANT
DRAPE UTILITY XL STRL (DRAPES) ×3 IMPLANT
ELECT REM PT RETURN 9FT ADLT (ELECTROSURGICAL) ×3
ELECTRODE REM PT RTRN 9FT ADLT (ELECTROSURGICAL) ×1 IMPLANT
GLOVE BIOGEL PI IND STRL 8 (GLOVE) ×1 IMPLANT
GLOVE BIOGEL PI INDICATOR 8 (GLOVE) ×2
GLOVE SURG SIGNA 7.5 PF LTX (GLOVE) ×3 IMPLANT
GLOVE SURG SS PI 8.0 STRL IVOR (GLOVE) ×3 IMPLANT
GOWN STRL REUS W/ TWL LRG LVL3 (GOWN DISPOSABLE) ×2 IMPLANT
GOWN STRL REUS W/ TWL XL LVL3 (GOWN DISPOSABLE) ×1 IMPLANT
GOWN STRL REUS W/TWL LRG LVL3 (GOWN DISPOSABLE) ×4
GOWN STRL REUS W/TWL XL LVL3 (GOWN DISPOSABLE) ×2
IV KIT MINILOC 20X1 SAFETY (NEEDLE) IMPLANT
KIT PORT POWER ISP 8FR (Catheter) ×3 IMPLANT
LIQUID BAND (GAUZE/BANDAGES/DRESSINGS) ×6 IMPLANT
NEEDLE HYPO 25X1 1.5 SAFETY (NEEDLE) ×3 IMPLANT
PACK BASIN DAY SURGERY FS (CUSTOM PROCEDURE TRAY) ×3 IMPLANT
PENCIL BUTTON HOLSTER BLD 10FT (ELECTRODE) ×3 IMPLANT
SLEEVE SCD COMPRESS KNEE MED (MISCELLANEOUS) ×3 IMPLANT
SUT MNCRL AB 4-0 PS2 18 (SUTURE) ×3 IMPLANT
SUT PROLENE 2 0 SH DA (SUTURE) ×3 IMPLANT
SUT SILK 2 0 TIES 17X18 (SUTURE)
SUT SILK 2-0 18XBRD TIE BLK (SUTURE) IMPLANT
SUT VIC AB 3-0 SH 27 (SUTURE) ×2
SUT VIC AB 3-0 SH 27X BRD (SUTURE) ×1 IMPLANT
SYR CONTROL 10ML LL (SYRINGE) ×3 IMPLANT
TOWEL OR 17X24 6PK STRL BLUE (TOWEL DISPOSABLE) ×3 IMPLANT
TOWEL OR NON WOVEN STRL DISP B (DISPOSABLE) ×3 IMPLANT
TUBE CONNECTING 20'X1/4 (TUBING) ×1
TUBE CONNECTING 20X1/4 (TUBING) ×2 IMPLANT
YANKAUER SUCT BULB TIP NO VENT (SUCTIONS) ×3 IMPLANT

## 2015-02-12 NOTE — Transfer of Care (Signed)
Immediate Anesthesia Transfer of Care Note  Patient: Maria Perez  Procedure(s) Performed: Procedure(s): INSERTION PORT-A-CATH (Left)  Patient Location: PACU  Anesthesia Type:General  Level of Consciousness: awake  Airway & Oxygen Therapy: Patient Spontanous Breathing and Patient connected to face mask oxygen  Post-op Assessment: Report given to RN and Post -op Vital signs reviewed and stable  Post vital signs: Reviewed and stable  Last Vitals:  Filed Vitals:   02/12/15 1012 02/12/15 1147  BP: 104/71   Pulse: 87 103  Temp: 37.1 C   Resp: 16     Complications: No apparent anesthesia complications

## 2015-02-12 NOTE — Anesthesia Procedure Notes (Signed)
Procedure Name: LMA Insertion Date/Time: 02/12/2015 10:57 AM Performed by: Toula Moos L Pre-anesthesia Checklist: Patient identified, Emergency Drugs available, Suction available, Patient being monitored and Timeout performed Patient Re-evaluated:Patient Re-evaluated prior to inductionOxygen Delivery Method: Circle System Utilized Preoxygenation: Pre-oxygenation with 100% oxygen Intubation Type: IV induction Ventilation: Mask ventilation without difficulty LMA: LMA inserted LMA Size: 4.0 Number of attempts: 1 Airway Equipment and Method: Bite block Placement Confirmation: positive ETCO2 Tube secured with: Tape Dental Injury: Teeth and Oropharynx as per pre-operative assessment

## 2015-02-12 NOTE — Anesthesia Postprocedure Evaluation (Signed)
Anesthesia Post Note  Patient: [name]  Procedure(s) Performed: Procedure(s) (LRB): INSERTION PORT-A-CATH (Left)  Patient location during evaluation: PACU Anesthesia Type: General Level of consciousness: awake and alert Pain management: pain level controlled Vital Signs Assessment: post-procedure vital signs reviewed and stable Respiratory status: spontaneous breathing Cardiovascular status: blood pressure returned to baseline Anesthetic complications: no    Last Vitals:  Filed Vitals:   02/12/15 1215 02/12/15 1330  BP: 119/76 141/76  Pulse: 88 83  Temp:  36.4 C  Resp: 15 16    Last Pain:  Filed Vitals:   02/12/15 1403  PainSc: 3                  Maria Perez

## 2015-02-12 NOTE — Discharge Instructions (Signed)
Ok to shower starting tomorrow  Call your surgeon if you experience:   1.  Fever over 101.0. 2.  Inability to urinate. 3.  Nausea and/or vomiting. 4.  Extreme swelling or bruising at the surgical site. 5.  Continued bleeding from the incision. 6.  Increased pain, redness or drainage from the incision. 7.  Problems related to your pain medication. 8. Any change in color, movement and/or sensation 9. Any problems and/or concerns  Post Anesthesia Home Care Instructions  Activity: Get plenty of rest for the remainder of the day. A responsible adult should stay with you for 24 hours following the procedure.  For the next 24 hours, DO NOT: -Drive a car -Paediatric nurse -Drink alcoholic beverages -Take any medication unless instructed by your physician -Make any legal decisions or sign important papers.  Meals: Start with liquid foods such as gelatin or soup. Progress to regular foods as tolerated. Avoid greasy, spicy, heavy foods. If nausea and/or vomiting occur, drink only clear liquids until the nausea and/or vomiting subsides. Call your physician if vomiting continues.  Special Instructions/Symptoms: Your throat may feel dry or sore from the anesthesia or the breathing tube placed in your throat during surgery. If this causes discomfort, gargle with warm salt water. The discomfort should disappear within 24 hours.  If you had a scopolamine patch placed behind your ear for the management of post- operative nausea and/or vomiting:  1. The medication in the patch is effective for 72 hours, after which it should be removed.  Wrap patch in a tissue and discard in the trash. Wash hands thoroughly with soap and water. 2. You may remove the patch earlier than 72 hours if you experience unpleasant side effects which may include dry mouth, dizziness or visual disturbances. 3. Avoid touching the patch. Wash your hands with soap and water after contact with the patch.

## 2015-02-12 NOTE — Interval H&P Note (Signed)
History and Physical Interval Note:no change in H and P  02/12/2015 10:27 AM  Maria Perez  has presented today for surgery, with the diagnosis of LYMPHOMA  The various methods of treatment have been discussed with the patient and family. After consideration of risks, benefits and other options for treatment, the patient has consented to  Procedure(s): INSERTION PORT-A-CATH (N/A) as a surgical intervention .  The patient's history has been reviewed, patient examined, no change in status, stable for surgery.  I have reviewed the patient's chart and labs.  Questions were answered to the patient's satisfaction.     [name]

## 2015-02-12 NOTE — Op Note (Signed)
INSERTION PORT-A-CATH  Procedure Note  SHALANDRIA MICHALOWSKI 02/12/2015   Pre-op Diagnosis: LYMPHOMA     Post-op Diagnosis: same  Procedure(s): INSERTION PORT-A-CATH LEFT SUBCLAVIAN VEIN (8FR)  Surgeon(s): Coralie Keens, MD  Anesthesia: Monitor Anesthesia Care  Staff:  Circulator: Humberto Seals, RN Radiology Technologist: Luetta Nutting Relief Scrub: Maurene Capes, RN Scrub Person: Vickey Huger, RN  Estimated Blood Loss: Minimal                         Jaylan Hinojosa A   Date: 02/12/2015  Time: 11:41 AM

## 2015-02-12 NOTE — Anesthesia Preprocedure Evaluation (Addendum)
Anesthesia Evaluation  Patient identified by MRN, date of birth, ID band Patient awake    Reviewed: Allergy & Precautions, NPO status , Patient's Chart, lab work & pertinent test results  Airway Mallampati: I  TM Distance: >3 FB Neck ROM: Full    Dental  (+) Teeth Intact, Dental Advisory Given   Pulmonary neg pulmonary ROS,    breath sounds clear to auscultation       Cardiovascular negative cardio ROS   Rhythm:Regular Rate:Normal     Neuro/Psych negative neurological ROS     GI/Hepatic negative GI ROS, Neg liver ROS,   Endo/Other  negative endocrine ROS  Renal/GU negative Renal ROS     Musculoskeletal   Abdominal   Peds  Hematology negative hematology ROS (+)   Anesthesia Other Findings   Reproductive/Obstetrics                             Anesthesia Physical  Anesthesia Plan  ASA: II  Anesthesia Plan: General   Post-op Pain Management:    Induction: Intravenous  Airway Management Planned: LMA  Additional Equipment:   Intra-op Plan:   Post-operative Plan: Extubation in OR  Informed Consent: I have reviewed the patients History and Physical, chart, labs and discussed the procedure including the risks, benefits and alternatives for the proposed anesthesia with the patient or authorized representative who has indicated his/her understanding and acceptance.   Dental advisory given  Plan Discussed with: CRNA, Anesthesiologist and Surgeon  Anesthesia Plan Comments:         Anesthesia Quick Evaluation

## 2015-02-13 ENCOUNTER — Encounter (HOSPITAL_BASED_OUTPATIENT_CLINIC_OR_DEPARTMENT_OTHER): Payer: Self-pay | Admitting: Surgery

## 2015-02-13 NOTE — Op Note (Signed)
NAME:  Maria Perez, Maria Perez                      ACCOUNT NO.:  MEDICAL RECORD NO.:  [id number]  LOCATION:                                 FACILITY:  PHYSICIAN:  Coralie Keens, M.D. DATE OF BIRTH:  Jul 20, 1940  DATE OF PROCEDURE:  02/12/2015 DATE OF DISCHARGE:                              OPERATIVE REPORT   PREOPERATIVE DIAGNOSIS:  Lymphoma.  POSTOPERATIVE DIAGNOSIS:  Lymphoma.  PROCEDURE:  Left subclavian vein Port-A-Cath insertion.  SURGEON:  Coralie Keens, MD  ANESTHESIA:  General and 0.5% Marcaine.  ESTIMATED BLOOD LOSS:  Minimal.  INDICATIONS:  This is a 75 year old female with a recent diagnosis of lymphoma.  She now presents for Port-A-Cath insertion for central access for IV chemotherapy.  PROCEDURE IN DETAIL:  The patient was brought to operating room, identified as Maria Perez.  She was placed supine on the operating room table, general anesthesia was induced.  Her chest and neck were then prepped and draped in usual sterile fashion on both sides.  I anesthetized skin of left-sided chest and clavicle with Marcaine.  I used an introducer needle to cannulate the left subclavian vein several times.  Under direct fluoroscopy, I was unable to pass the needle into the central venous system.  At this point, I decided to forego on the left side and attempted low right side.  I made multiple passes with the needle as well, but was unable to cannulate the vein, although I cannulated the artery one time.  We placed the patient in deep Trendelenburg position, gave more IV fluid, and I returned to the left chest.  I then was able to easily cannulate the left subclavian vein, again at this time, the wire passed into the central venous system under direct fluoroscopy.  I removed the needle and anesthetized the chest further.  I then made an incision with a scalpel at the introducer site and created a pocket for the port.  An 8-French ClearVUE Port-A-Cath was brought to the field.   It was flushed appropriately.  It fit easily in the pocket.  I placed the introducer sheath and dilator over the wire and inserted into the central venous system easily.  I then removed the dilator and wire.  I then placed a catheter to the port and placed the port into the pocket.  I then cut the catheter to appropriate length and slided down the introducer sheath.  The sheath was then peeled away leaving the catheter in central venous system.  Fluoroscopy again confirmed placement in the superior vena cava.  I accessed the port and good flush return were demonstrated.  I then instilled several mL of concentrated heparin solution into the port.  The port was then sewn to the chest wall with 2 separate 3-0 Prolene sutures.  I then closed the subcutaneous tissue with interrupted 3-0 Vicryl sutures and closed the skin with a running 4-0 Monocryl.  Skin glue was then applied.  The patient tolerated the procedure well.  All the counts were correct at the end of procedure.  The patient was then extubated in the operating room and taken in stable condition to recovery room.  Coralie Keens, M.D.     DB/MEDQ  D:  02/12/2015  T:  02/12/2015  Job:  [id number]

## 2015-02-17 ENCOUNTER — Other Ambulatory Visit: Payer: Self-pay | Admitting: Hematology and Oncology

## 2015-02-17 ENCOUNTER — Ambulatory Visit (HOSPITAL_BASED_OUTPATIENT_CLINIC_OR_DEPARTMENT_OTHER): Payer: Medicare Other | Admitting: Hematology and Oncology

## 2015-02-17 ENCOUNTER — Ambulatory Visit (HOSPITAL_BASED_OUTPATIENT_CLINIC_OR_DEPARTMENT_OTHER): Payer: Medicare Other

## 2015-02-17 ENCOUNTER — Encounter: Payer: Self-pay | Admitting: Hematology and Oncology

## 2015-02-17 ENCOUNTER — Telehealth: Payer: Self-pay | Admitting: Hematology and Oncology

## 2015-02-17 ENCOUNTER — Other Ambulatory Visit (HOSPITAL_BASED_OUTPATIENT_CLINIC_OR_DEPARTMENT_OTHER): Payer: Medicare Other

## 2015-02-17 ENCOUNTER — Telehealth: Payer: Self-pay | Admitting: *Deleted

## 2015-02-17 ENCOUNTER — Other Ambulatory Visit: Payer: Self-pay

## 2015-02-17 VITALS — BP 102/52 | HR 79 | Temp 98.2°F | Resp 18

## 2015-02-17 VITALS — BP 121/65 | HR 99 | Temp 98.0°F | Resp 17 | Wt 137.1 lb

## 2015-02-17 DIAGNOSIS — C8338 Diffuse large B-cell lymphoma, lymph nodes of multiple sites: Secondary | ICD-10-CM

## 2015-02-17 DIAGNOSIS — F39 Unspecified mood [affective] disorder: Secondary | ICD-10-CM | POA: Diagnosis not present

## 2015-02-17 DIAGNOSIS — R4586 Emotional lability: Secondary | ICD-10-CM

## 2015-02-17 DIAGNOSIS — R971 Elevated cancer antigen 125 [CA 125]: Secondary | ICD-10-CM | POA: Diagnosis not present

## 2015-02-17 DIAGNOSIS — Z5111 Encounter for antineoplastic chemotherapy: Secondary | ICD-10-CM

## 2015-02-17 DIAGNOSIS — Z5112 Encounter for antineoplastic immunotherapy: Secondary | ICD-10-CM | POA: Diagnosis not present

## 2015-02-17 LAB — COMPREHENSIVE METABOLIC PANEL
ALT: 13 U/L (ref 0–55)
ANION GAP: 9 meq/L (ref 3–11)
AST: 15 U/L (ref 5–34)
Albumin: 3.5 g/dL (ref 3.5–5.0)
Alkaline Phosphatase: 82 U/L (ref 40–150)
BUN: 12.4 mg/dL (ref 7.0–26.0)
CHLORIDE: 105 meq/L (ref 98–109)
CO2: 27 meq/L (ref 22–29)
CREATININE: 0.8 mg/dL (ref 0.6–1.1)
Calcium: 9.3 mg/dL (ref 8.4–10.4)
EGFR: 75 mL/min/{1.73_m2} — AB (ref >90)
Glucose: 75 mg/dl (ref 70–140)
Potassium: 4.3 mEq/L (ref 3.5–5.1)
Sodium: 141 mEq/L (ref 136–145)
Total Bilirubin: 0.52 mg/dL (ref 0.20–1.20)
Total Protein: 6.4 g/dL (ref 6.4–8.3)

## 2015-02-17 LAB — CBC WITH DIFFERENTIAL/PLATELET
BASO%: 1.3 % (ref 0.0–2.0)
Basophils Absolute: 0.1 10*3/uL (ref 0.0–0.1)
EOS%: 3.1 % (ref 0.0–7.0)
Eosinophils Absolute: 0.1 10*3/uL (ref 0.0–0.5)
HCT: 38.8 % (ref 34.8–46.6)
HGB: 13 g/dL (ref 11.6–15.9)
LYMPH%: 15.1 % (ref 14.0–49.7)
MCH: 29.4 pg (ref 25.1–34.0)
MCHC: 33.4 g/dL (ref 31.5–36.0)
MCV: 88 fL (ref 79.5–101.0)
MONO#: 1.1 10*3/uL — AB (ref 0.1–0.9)
MONO%: 23.3 % — AB (ref 0.0–14.0)
NEUT#: 2.7 10*3/uL (ref 1.5–6.5)
NEUT%: 57.2 % (ref 38.4–76.8)
PLATELETS: 398 10*3/uL (ref 145–400)
RBC: 4.41 10*6/uL (ref 3.70–5.45)
RDW: 13.4 % (ref 11.2–14.5)
WBC: 4.8 10*3/uL (ref 3.9–10.3)
lymph#: 0.7 10*3/uL — ABNORMAL LOW (ref 0.9–3.3)

## 2015-02-17 MED ORDER — DIPHENHYDRAMINE HCL 25 MG PO CAPS
ORAL_CAPSULE | ORAL | Status: AC
  Filled 2015-02-17: qty 2

## 2015-02-17 MED ORDER — SODIUM CHLORIDE 0.9 % IV SOLN
Freq: Once | INTRAVENOUS | Status: AC
  Administered 2015-02-17: 10:00:00 via INTRAVENOUS

## 2015-02-17 MED ORDER — ACETAMINOPHEN 325 MG PO TABS
ORAL_TABLET | ORAL | Status: AC
  Filled 2015-02-17: qty 2

## 2015-02-17 MED ORDER — DEXAMETHASONE SODIUM PHOSPHATE 100 MG/10ML IJ SOLN
Freq: Once | INTRAMUSCULAR | Status: AC
  Administered 2015-02-17: 10:00:00 via INTRAVENOUS
  Filled 2015-02-17: qty 8

## 2015-02-17 MED ORDER — SODIUM CHLORIDE 0.9 % IV SOLN
375.0000 mg/m2 | Freq: Once | INTRAVENOUS | Status: AC
  Administered 2015-02-17: 600 mg via INTRAVENOUS
  Filled 2015-02-17: qty 60

## 2015-02-17 MED ORDER — DOXORUBICIN HCL CHEMO IV INJECTION 2 MG/ML
50.0000 mg/m2 | Freq: Once | INTRAVENOUS | Status: AC
  Administered 2015-02-17: 86 mg via INTRAVENOUS
  Filled 2015-02-17: qty 43

## 2015-02-17 MED ORDER — HEPARIN SOD (PORK) LOCK FLUSH 100 UNIT/ML IV SOLN
500.0000 [IU] | Freq: Once | INTRAVENOUS | Status: AC | PRN
  Administered 2015-02-17: 500 [IU]
  Filled 2015-02-17: qty 5

## 2015-02-17 MED ORDER — ACETAMINOPHEN 325 MG PO TABS
650.0000 mg | ORAL_TABLET | Freq: Once | ORAL | Status: AC
  Administered 2015-02-17: 650 mg via ORAL

## 2015-02-17 MED ORDER — SODIUM CHLORIDE 0.9 % IV SOLN
750.0000 mg/m2 | Freq: Once | INTRAVENOUS | Status: AC
  Administered 2015-02-17: 1280 mg via INTRAVENOUS
  Filled 2015-02-17: qty 64

## 2015-02-17 MED ORDER — SODIUM CHLORIDE 0.9 % IJ SOLN
10.0000 mL | INTRAMUSCULAR | Status: DC | PRN
  Administered 2015-02-17: 10 mL
  Filled 2015-02-17: qty 10

## 2015-02-17 MED ORDER — DIPHENHYDRAMINE HCL 25 MG PO CAPS
50.0000 mg | ORAL_CAPSULE | Freq: Once | ORAL | Status: AC
  Administered 2015-02-17: 50 mg via ORAL

## 2015-02-17 MED ORDER — VINCRISTINE SULFATE CHEMO INJECTION 1 MG/ML
2.0000 mg | Freq: Once | INTRAVENOUS | Status: AC
  Administered 2015-02-17: 2 mg via INTRAVENOUS
  Filled 2015-02-17: qty 2

## 2015-02-17 NOTE — Patient Instructions (Signed)
[facility] Discharge Instructions for Patients Receiving Chemotherapy  Today you received the following chemotherapy agents:  Adriamycin, Vincristine, Cytoxan, Rituxan. To help prevent nausea and vomiting after your treatment, we encourage you to take your nausea medication as prescribed.  If you develop nausea and vomiting that is not controlled by your nausea medication, call the clinic.   BELOW ARE SYMPTOMS THAT SHOULD BE REPORTED IMMEDIATELY:  *FEVER GREATER THAN 100.5 F  *CHILLS WITH OR WITHOUT FEVER  NAUSEA AND VOMITING THAT IS NOT CONTROLLED WITH YOUR NAUSEA MEDICATION  *UNUSUAL SHORTNESS OF BREATH  *UNUSUAL BRUISING OR BLEEDING  TENDERNESS IN MOUTH AND THROAT WITH OR WITHOUT PRESENCE OF ULCERS  *URINARY PROBLEMS  *BOWEL PROBLEMS  UNUSUAL RASH Items with * indicate a potential emergency and should be followed up as soon as possible.  Feel free to call the clinic you have any questions or concerns. The clinic phone number is [phone].  Please show the CHEMO ALERT CARD at check-in to the Emergency Department and triage nurse.     

## 2015-02-17 NOTE — Assessment & Plan Note (Signed)
She had recent what swelling which I attributed that to prednisone therapy. If it starts back again, recommend she reduce prednisone to 40 mg instead of 60 mg

## 2015-02-17 NOTE — Telephone Encounter (Signed)
Pt confirmed labs/ov per 02/07 POF, gave pt AVS and Calendar... KJ °

## 2015-02-17 NOTE — Progress Notes (Signed)
[facility] OFFICE PROGRESS NOTE  Patient Care Team: Signe Colt, MD as PCP - General (Obstetrics and Gynecology) Coralie Keens, MD as Consulting Physician (General Surgery)  SUMMARY OF ONCOLOGIC HISTORY:   Diffuse large b-cell lymphoma, lymph nodes of multiple sites [facility])   01/07/2015 Procedure She had excisional biopsy of left axilla   01/07/2015 Pathology Results Accession: [id number] showed follicular and DLBCL   04/16/9627 Imaging PET scan showed stage III disease   01/20/2015 Bone Marrow Biopsy BM biopsy showed normal cytogenetics with possible involvement of low grade lymphoma.  Accession: [id number]   01/22/2015 Imaging [facility] showed normal EF   01/27/2015 -  Chemotherapy She received R-CHOP chemo   02/12/2015 Procedure She had port placement    INTERVAL HISTORY: Please see below for problem oriented charting.  She is seen prior to cycle 2 of chemotherapy. She tolerated cycle 1 well apart from mood swings with prednisone.  Denies mucositis, nausea or vomiting She had recent port placement and tolerated that well. She showed me copies of blood work from November 2016 which show elevated CA 125. She denies pelvic discomfort or postmenopausal bleeding  REVIEW OF SYSTEMS:   Constitutional: Denies fevers, chills or abnormal weight loss Eyes: Denies blurriness of vision Ears, nose, mouth, throat, and face: Denies mucositis or sore throat Respiratory: Denies cough, dyspnea or wheezes Cardiovascular: Denies palpitation, chest discomfort or lower extremity swelling Gastrointestinal:  Denies nausea, heartburn or change in bowel habits Skin: Denies abnormal skin rashes Lymphatics: Denies new lymphadenopathy or easy bruising Neurological:Denies numbness, tingling or new weaknesses Behavioral/Psych: Mood is stable, no new changes  All other systems were reviewed with the patient and are negative.  I have reviewed the past medical history, past surgical history, social  history and family history with the patient and they are unchanged from previous note.  ALLERGIES:  has No Known Allergies.  MEDICATIONS:  Current Outpatient Prescriptions  Medication Sig Dispense Refill  . allopurinol (ZYLOPRIM) 300 MG tablet Take 1 tablet (300 mg total) by mouth daily. 30 tablet 0  . calcium carbonate (OSCAL) 1500 (600 CA) MG TABS tablet Take by mouth 2 (two) times daily with a meal.    . cetirizine (ZYRTEC) 10 MG tablet Take 10 mg by mouth daily.    Marland Kitchen lidocaine-prilocaine (EMLA) cream Apply to affected area once 30 g 3  . ondansetron (ZOFRAN) 8 MG tablet Take 1 tablet (8 mg total) by mouth every 8 (eight) hours as needed. 30 tablet 1  . predniSONE (DELTASONE) 20 MG tablet Take 3 tablets (60 mg total) by mouth daily. Take on days 2-5 of chemotherapy every 3 weeks 60 tablet 6  . prochlorperazine (COMPAZINE) 10 MG tablet Take 1 tablet (10 mg total) by mouth every 6 (six) hours as needed (Nausea or vomiting). 30 tablet 6   No current facility-administered medications for this visit.   Facility-Administered Medications Ordered in Other Visits  Medication Dose Route Frequency Provider Last Rate Last Dose  . cyclophosphamide (CYTOXAN) 1,280 mg in sodium chloride 0.9 % 250 mL chemo infusion  750 mg/m2 (Treatment Plan Actual) Intravenous Once Heath Lark, MD      . DOXOrubicin (ADRIAMYCIN) chemo injection 86 mg  50 mg/m2 (Treatment Plan Actual) Intravenous Once Heath Lark, MD      . heparin lock flush 100 unit/mL  500 Units Intracatheter Once PRN Heath Lark, MD      . sodium chloride 0.9 % injection 10 mL  10 mL Intracatheter PRN Heath Lark, MD      .  vinCRIStine (ONCOVIN) 2 mg in sodium chloride 0.9 % 50 mL chemo infusion  2 mg Intravenous Once Heath Lark, MD        PHYSICAL EXAMINATION: ECOG PERFORMANCE STATUS: 0 - Asymptomatic  Filed Vitals:   02/17/15 0840  BP: 121/65  Pulse: 99  Temp: 98 F (36.7 C)  Resp: 17   Filed Weights   02/17/15 0840  Weight: 137 lb 1.6 oz  (62.188 kg)    GENERAL:alert, no distress and comfortable SKIN: skin color, texture, turgor are normal, no rashes or significant lesions EYES: normal, Conjunctiva are pink and non-injected, sclera clear OROPHARYNX:no exudate, no erythema and lips, buccal mucosa, and tongue normal  NECK: supple, thyroid normal size, non-tender, without nodularity LYMPH:  no palpable lymphadenopathy in the cervical, axillary or inguinal LUNGS: clear to auscultation and percussion with normal breathing effort HEART: regular rate & rhythm and no murmurs and no lower extremity edema ABDOMEN:abdomen soft, non-tender and normal bowel sounds Musculoskeletal:no cyanosis of digits and no clubbing  NEURO: alert & oriented x 3 with fluent speech, no focal motor/sensory deficits  LABORATORY DATA:  I have reviewed the data as listed    Component Value Date/Time   NA 141 02/17/2015 0830   K 4.3 02/17/2015 0830   CO2 27 02/17/2015 0830   GLUCOSE 75 02/17/2015 0830   BUN 12.4 02/17/2015 0830   CREATININE 0.8 02/17/2015 0830   CALCIUM 9.3 02/17/2015 0830   PROT 6.4 02/17/2015 0830   ALBUMIN 3.5 02/17/2015 0830   AST 15 02/17/2015 0830   ALT 13 02/17/2015 0830   ALKPHOS 82 02/17/2015 0830   BILITOT 0.52 02/17/2015 0830    No results found for: SPEP, UPEP  Lab Results  Component Value Date   WBC 4.8 02/17/2015   NEUTROABS 2.7 02/17/2015   HGB 13.0 02/17/2015   HCT 38.8 02/17/2015   MCV 88.0 02/17/2015   PLT 398 02/17/2015      Chemistry      Component Value Date/Time   NA 141 02/17/2015 0830   K 4.3 02/17/2015 0830   CO2 27 02/17/2015 0830   BUN 12.4 02/17/2015 0830   CREATININE 0.8 02/17/2015 0830      Component Value Date/Time   CALCIUM 9.3 02/17/2015 0830   ALKPHOS 82 02/17/2015 0830   AST 15 02/17/2015 0830   ALT 13 02/17/2015 0830   BILITOT 0.52 02/17/2015 0830      ASSESSMENT & PLAN:  Diffuse large b-cell lymphoma, lymph nodes of multiple sites ([place])  Overall, she tolerated cycle   1 of treatment well. We will resume cycle 2 without dose adjustment.  She will finish her allopurinol soon.  Plan for another 2 cycles of treatment before repeat PET/CT scan  Mood swing ([place])  She had recent what swelling which I attributed that to prednisone therapy. If it starts back again, recommend she reduce prednisone to 40 mg instead of 60 mg  Elevated CA-125  She was noted to have elevated CA 125 in November. PET CT scan showed no evidence of ovarian masses. I recommend observation only   No orders of the defined types were placed in this encounter.   All questions were answered. The patient knows to call the clinic with any problems, questions or concerns. No barriers to learning was detected. I spent 25 minutes counseling the patient face to face. The total time spent in the appointment was 30 minutes and more than 50% was on counseling and review of test results  [place], [place], MD 02/17/2015 11:35 AM

## 2015-02-17 NOTE — Assessment & Plan Note (Signed)
She was noted to have elevated CA 125 in November. PET CT scan showed no evidence of ovarian masses. I recommend observation only

## 2015-02-17 NOTE — Telephone Encounter (Signed)
Per staff message and POF I have scheduled appts. Advised scheduler of appts. JMW  

## 2015-02-17 NOTE — Assessment & Plan Note (Signed)
Overall, she tolerated cycle  1 of treatment well. We will resume cycle 2 without dose adjustment.  She will finish her allopurinol soon.  Plan for another 2 cycles of treatment before repeat PET/CT scan

## 2015-02-19 ENCOUNTER — Ambulatory Visit (HOSPITAL_BASED_OUTPATIENT_CLINIC_OR_DEPARTMENT_OTHER): Payer: Medicare Other

## 2015-02-19 VITALS — BP 119/63 | HR 89 | Temp 98.9°F

## 2015-02-19 DIAGNOSIS — Z5189 Encounter for other specified aftercare: Secondary | ICD-10-CM | POA: Diagnosis not present

## 2015-02-19 DIAGNOSIS — C8338 Diffuse large B-cell lymphoma, lymph nodes of multiple sites: Secondary | ICD-10-CM

## 2015-02-19 MED ORDER — PEGFILGRASTIM INJECTION 6 MG/0.6ML ~~LOC~~
6.0000 mg | PREFILLED_SYRINGE | Freq: Once | SUBCUTANEOUS | Status: AC
  Administered 2015-02-19: 6 mg via SUBCUTANEOUS
  Filled 2015-02-19: qty 0.6

## 2015-03-02 ENCOUNTER — Encounter (HOSPITAL_COMMUNITY): Payer: Self-pay

## 2015-03-10 ENCOUNTER — Other Ambulatory Visit (HOSPITAL_BASED_OUTPATIENT_CLINIC_OR_DEPARTMENT_OTHER): Payer: Medicare Other

## 2015-03-10 ENCOUNTER — Ambulatory Visit (HOSPITAL_BASED_OUTPATIENT_CLINIC_OR_DEPARTMENT_OTHER): Payer: Medicare Other | Admitting: Hematology and Oncology

## 2015-03-10 ENCOUNTER — Ambulatory Visit: Payer: Medicare Other

## 2015-03-10 ENCOUNTER — Encounter: Payer: Self-pay | Admitting: Hematology and Oncology

## 2015-03-10 ENCOUNTER — Telehealth: Payer: Self-pay | Admitting: Hematology and Oncology

## 2015-03-10 ENCOUNTER — Ambulatory Visit (HOSPITAL_BASED_OUTPATIENT_CLINIC_OR_DEPARTMENT_OTHER): Payer: Medicare Other

## 2015-03-10 VITALS — BP 96/46 | HR 86 | Temp 98.6°F | Resp 18

## 2015-03-10 VITALS — BP 106/61 | HR 81 | Temp 98.3°F | Resp 18 | Wt 137.3 lb

## 2015-03-10 DIAGNOSIS — Z5112 Encounter for antineoplastic immunotherapy: Secondary | ICD-10-CM

## 2015-03-10 DIAGNOSIS — R5383 Other fatigue: Secondary | ICD-10-CM | POA: Diagnosis not present

## 2015-03-10 DIAGNOSIS — C8338 Diffuse large B-cell lymphoma, lymph nodes of multiple sites: Secondary | ICD-10-CM

## 2015-03-10 DIAGNOSIS — Z5111 Encounter for antineoplastic chemotherapy: Secondary | ICD-10-CM | POA: Diagnosis not present

## 2015-03-10 LAB — COMPREHENSIVE METABOLIC PANEL
ALT: 17 U/L (ref 0–55)
ANION GAP: 8 meq/L (ref 3–11)
AST: 17 U/L (ref 5–34)
Albumin: 3.5 g/dL (ref 3.5–5.0)
Alkaline Phosphatase: 91 U/L (ref 40–150)
BUN: 15.8 mg/dL (ref 7.0–26.0)
CHLORIDE: 108 meq/L (ref 98–109)
CO2: 25 meq/L (ref 22–29)
CREATININE: 0.8 mg/dL (ref 0.6–1.1)
Calcium: 9.4 mg/dL (ref 8.4–10.4)
EGFR: 75 mL/min/{1.73_m2} — AB (ref >90)
Glucose: 90 mg/dl (ref 70–140)
Potassium: 4.3 mEq/L (ref 3.5–5.1)
Sodium: 141 mEq/L (ref 136–145)
Total Bilirubin: 0.31 mg/dL (ref 0.20–1.20)
Total Protein: 6.3 g/dL — ABNORMAL LOW (ref 6.4–8.3)

## 2015-03-10 LAB — CBC WITH DIFFERENTIAL/PLATELET
BASO%: 1.8 % (ref 0.0–2.0)
Basophils Absolute: 0.1 10*3/uL (ref 0.0–0.1)
EOS ABS: 0.1 10*3/uL (ref 0.0–0.5)
EOS%: 3.5 % (ref 0.0–7.0)
HCT: 35.7 % (ref 34.8–46.6)
HGB: 12 g/dL (ref 11.6–15.9)
LYMPH#: 0.6 10*3/uL — AB (ref 0.9–3.3)
LYMPH%: 14.4 % (ref 14.0–49.7)
MCH: 30.1 pg (ref 25.1–34.0)
MCHC: 33.6 g/dL (ref 31.5–36.0)
MCV: 89.5 fL (ref 79.5–101.0)
MONO#: 1.2 10*3/uL — AB (ref 0.1–0.9)
MONO%: 30.2 % — ABNORMAL HIGH (ref 0.0–14.0)
NEUT#: 2 10*3/uL (ref 1.5–6.5)
NEUT%: 50.1 % (ref 38.4–76.8)
PLATELETS: 370 10*3/uL (ref 145–400)
RBC: 3.99 10*6/uL (ref 3.70–5.45)
RDW: 15.1 % — AB (ref 11.2–14.5)
WBC: 4 10*3/uL (ref 3.9–10.3)

## 2015-03-10 MED ORDER — SODIUM CHLORIDE 0.9 % IV SOLN
Freq: Once | INTRAVENOUS | Status: AC
  Administered 2015-03-10: 10:00:00 via INTRAVENOUS

## 2015-03-10 MED ORDER — HEPARIN SOD (PORK) LOCK FLUSH 100 UNIT/ML IV SOLN
500.0000 [IU] | Freq: Once | INTRAVENOUS | Status: AC
  Administered 2015-03-10: 500 [IU] via INTRAVENOUS
  Filled 2015-03-10: qty 5

## 2015-03-10 MED ORDER — ACETAMINOPHEN 325 MG PO TABS
ORAL_TABLET | ORAL | Status: AC
  Filled 2015-03-10: qty 2

## 2015-03-10 MED ORDER — VINCRISTINE SULFATE CHEMO INJECTION 1 MG/ML
2.0000 mg | Freq: Once | INTRAVENOUS | Status: AC
  Administered 2015-03-10: 2 mg via INTRAVENOUS
  Filled 2015-03-10: qty 2

## 2015-03-10 MED ORDER — HEPARIN SOD (PORK) LOCK FLUSH 100 UNIT/ML IV SOLN
500.0000 [IU] | Freq: Once | INTRAVENOUS | Status: AC | PRN
  Administered 2015-03-10: 500 [IU]
  Filled 2015-03-10: qty 5

## 2015-03-10 MED ORDER — DIPHENHYDRAMINE HCL 25 MG PO CAPS
ORAL_CAPSULE | ORAL | Status: AC
  Filled 2015-03-10: qty 2

## 2015-03-10 MED ORDER — SODIUM CHLORIDE 0.9 % IV SOLN
375.0000 mg/m2 | Freq: Once | INTRAVENOUS | Status: AC
  Administered 2015-03-10: 600 mg via INTRAVENOUS
  Filled 2015-03-10: qty 50

## 2015-03-10 MED ORDER — SODIUM CHLORIDE 0.9 % IJ SOLN
10.0000 mL | INTRAMUSCULAR | Status: DC | PRN
  Administered 2015-03-10: 10 mL
  Filled 2015-03-10: qty 10

## 2015-03-10 MED ORDER — SODIUM CHLORIDE 0.9 % IV SOLN
750.0000 mg/m2 | Freq: Once | INTRAVENOUS | Status: AC
  Administered 2015-03-10: 1280 mg via INTRAVENOUS
  Filled 2015-03-10: qty 64

## 2015-03-10 MED ORDER — DEXAMETHASONE SODIUM PHOSPHATE 100 MG/10ML IJ SOLN
Freq: Once | INTRAMUSCULAR | Status: AC
  Administered 2015-03-10: 11:00:00 via INTRAVENOUS
  Filled 2015-03-10: qty 8

## 2015-03-10 MED ORDER — ACETAMINOPHEN 325 MG PO TABS
650.0000 mg | ORAL_TABLET | Freq: Once | ORAL | Status: AC
  Administered 2015-03-10: 650 mg via ORAL

## 2015-03-10 MED ORDER — DIPHENHYDRAMINE HCL 25 MG PO CAPS
50.0000 mg | ORAL_CAPSULE | Freq: Once | ORAL | Status: AC
  Administered 2015-03-10: 50 mg via ORAL

## 2015-03-10 MED ORDER — SODIUM CHLORIDE 0.9% FLUSH
10.0000 mL | INTRAVENOUS | Status: DC | PRN
  Administered 2015-03-10: 10 mL via INTRAVENOUS
  Filled 2015-03-10: qty 10

## 2015-03-10 MED ORDER — DOXORUBICIN HCL CHEMO IV INJECTION 2 MG/ML
50.0000 mg/m2 | Freq: Once | INTRAVENOUS | Status: AC
  Administered 2015-03-10: 86 mg via INTRAVENOUS
  Filled 2015-03-10: qty 43

## 2015-03-10 NOTE — Patient Instructions (Signed)
[facility] Discharge Instructions for Patients Receiving Chemotherapy  Today you received the following chemotherapy agents:  Adriamycin, Vincristine, Cytoxan, Rituxan. To help prevent nausea and vomiting after your treatment, we encourage you to take your nausea medication as prescribed.  If you develop nausea and vomiting that is not controlled by your nausea medication, call the clinic.   BELOW ARE SYMPTOMS THAT SHOULD BE REPORTED IMMEDIATELY:  *FEVER GREATER THAN 100.5 F  *CHILLS WITH OR WITHOUT FEVER  NAUSEA AND VOMITING THAT IS NOT CONTROLLED WITH YOUR NAUSEA MEDICATION  *UNUSUAL SHORTNESS OF BREATH  *UNUSUAL BRUISING OR BLEEDING  TENDERNESS IN MOUTH AND THROAT WITH OR WITHOUT PRESENCE OF ULCERS  *URINARY PROBLEMS  *BOWEL PROBLEMS  UNUSUAL RASH Items with * indicate a potential emergency and should be followed up as soon as possible.  Feel free to call the clinic you have any questions or concerns. The clinic phone number is [phone].  Please show the CHEMO ALERT CARD at check-in to the Emergency Department and triage nurse.     

## 2015-03-10 NOTE — Patient Instructions (Signed)

## 2015-03-10 NOTE — Progress Notes (Signed)
[facility] OFFICE PROGRESS NOTE  Patient Care Team: Signe Colt, MD as PCP - General (Obstetrics and Gynecology) Coralie Keens, MD as Consulting Physician (General Surgery)  SUMMARY OF ONCOLOGIC HISTORY:   Diffuse large b-cell lymphoma, lymph nodes of multiple sites [facility])   01/07/2015 Procedure She had excisional biopsy of left axilla   01/07/2015 Pathology Results Accession: [id number] showed follicular and DLBCL   06/15/4401 Imaging PET scan showed stage III disease   01/20/2015 Bone Marrow Biopsy BM biopsy showed normal cytogenetics with possible involvement of low grade lymphoma.  Accession: [id number]   01/22/2015 Imaging [facility] showed normal EF   01/27/2015 -  Chemotherapy She received R-CHOP chemo   02/12/2015 Procedure She had port placement    INTERVAL HISTORY: Please see below for problem oriented charting. She is seen prior to cycle 3 of chemotherapy. She complained of mild fatigue after last treatment. She denies worsening mood swings despite lowering the dose of prednisone. She is requesting disability parking permit due to fatigue  REVIEW OF SYSTEMS:   Constitutional: Denies fevers, chills or abnormal weight loss Eyes: Denies blurriness of vision Ears, nose, mouth, throat, and face: Denies mucositis or sore throat Respiratory: Denies cough, dyspnea or wheezes Cardiovascular: Denies palpitation, chest discomfort or lower extremity swelling Gastrointestinal:  Denies nausea, heartburn or change in bowel habits Skin: Denies abnormal skin rashes Lymphatics: Denies new lymphadenopathy or easy bruising Neurological:Denies numbness, tingling or new weaknesses Behavioral/Psych: Mood is stable, no new changes  All other systems were reviewed with the patient and are negative.  I have reviewed the past medical history, past surgical history, social history and family history with the patient and they are unchanged from previous note.  ALLERGIES:  has No Known  Allergies.  MEDICATIONS:  Current Outpatient Prescriptions  Medication Sig Dispense Refill  . allopurinol (ZYLOPRIM) 300 MG tablet Take 1 tablet (300 mg total) by mouth daily. 30 tablet 0  . calcium carbonate (OSCAL) 1500 (600 CA) MG TABS tablet Take by mouth 2 (two) times daily with a meal.    . cetirizine (ZYRTEC) 10 MG tablet Take 10 mg by mouth daily.    Marland Kitchen lidocaine-prilocaine (EMLA) cream Apply to affected area once 30 g 3  . ondansetron (ZOFRAN) 8 MG tablet Take 1 tablet (8 mg total) by mouth every 8 (eight) hours as needed. 30 tablet 1  . predniSONE (DELTASONE) 20 MG tablet Take 3 tablets (60 mg total) by mouth daily. Take on days 2-5 of chemotherapy every 3 weeks 60 tablet 6  . prochlorperazine (COMPAZINE) 10 MG tablet Take 1 tablet (10 mg total) by mouth every 6 (six) hours as needed (Nausea or vomiting). 30 tablet 6   No current facility-administered medications for this visit.   Facility-Administered Medications Ordered in Other Visits  Medication Dose Route Frequency Provider Last Rate Last Dose  . cyclophosphamide (CYTOXAN) 1,280 mg in sodium chloride 0.9 % 250 mL chemo infusion  750 mg/m2 (Treatment Plan Actual) Intravenous Once Heath Lark, MD      . DOXOrubicin (ADRIAMYCIN) chemo injection 86 mg  50 mg/m2 (Treatment Plan Actual) Intravenous Once Heath Lark, MD      . heparin lock flush 100 unit/mL  500 Units Intracatheter Once PRN Heath Lark, MD      . riTUXimab (RITUXAN) 600 mg in sodium chloride 0.9 % 190 mL chemo infusion  375 mg/m2 (Treatment Plan Actual) Intravenous Once Heath Lark, MD      . sodium chloride 0.9 % injection 10 mL  10 mL Intracatheter PRN Heath Lark, MD      . vinCRIStine (ONCOVIN) 2 mg in sodium chloride 0.9 % 50 mL chemo infusion  2 mg Intravenous Once Heath Lark, MD        PHYSICAL EXAMINATION: ECOG PERFORMANCE STATUS: 1 - Symptomatic but completely ambulatory  Filed Vitals:   03/10/15 0933  BP: 106/61  Pulse: 81  Temp: 98.3 F (36.8 C)  Resp:  18   Filed Weights   03/10/15 0933  Weight: 137 lb 4.8 oz (62.279 kg)    GENERAL:alert, no distress and comfortable SKIN: skin color, texture, turgor are normal, no rashes or significant lesions EYES: normal, Conjunctiva are pink and non-injected, sclera clear OROPHARYNX:no exudate, no erythema and lips, buccal mucosa, and tongue normal  NECK: supple, thyroid normal size, non-tender, without nodularity LYMPH:  no palpable lymphadenopathy in the cervical, axillary or inguinal LUNGS: clear to auscultation and percussion with normal breathing effort HEART: regular rate & rhythm and no murmurs and no lower extremity edema ABDOMEN:abdomen soft, non-tender and normal bowel sounds Musculoskeletal:no cyanosis of digits and no clubbing  NEURO: alert & oriented x 3 with fluent speech, no focal motor/sensory deficits  LABORATORY DATA:  I have reviewed the data as listed    Component Value Date/Time   NA 141 03/10/2015 0831   K 4.3 03/10/2015 0831   CO2 25 03/10/2015 0831   GLUCOSE 90 03/10/2015 0831   BUN 15.8 03/10/2015 0831   CREATININE 0.8 03/10/2015 0831   CALCIUM 9.4 03/10/2015 0831   PROT 6.3* 03/10/2015 0831   ALBUMIN 3.5 03/10/2015 0831   AST 17 03/10/2015 0831   ALT 17 03/10/2015 0831   ALKPHOS 91 03/10/2015 0831   BILITOT 0.31 03/10/2015 0831    No results found for: SPEP, UPEP  Lab Results  Component Value Date   WBC 4.0 03/10/2015   NEUTROABS 2.0 03/10/2015   HGB 12.0 03/10/2015   HCT 35.7 03/10/2015   MCV 89.5 03/10/2015   PLT 370 03/10/2015      Chemistry      Component Value Date/Time   NA 141 03/10/2015 0831   K 4.3 03/10/2015 0831   CO2 25 03/10/2015 0831   BUN 15.8 03/10/2015 0831   CREATININE 0.8 03/10/2015 0831      Component Value Date/Time   CALCIUM 9.4 03/10/2015 0831   ALKPHOS 91 03/10/2015 0831   AST 17 03/10/2015 0831   ALT 17 03/10/2015 0831   BILITOT 0.31 03/10/2015 0831       ASSESSMENT & PLAN:  Diffuse large b-cell lymphoma,  lymph nodes of multiple sites ([place])  Overall, she tolerated cycle 2 of treatment well. We will resume cycle 3 without dose adjustment. I plan to repeat PET/CT scan before cycle 4 therapy Previously, we reduced the dose of prednisone because of mood swings. She did not feel any difference with the last cycle of treatment. I recommend she resume prednisone at full dose at 60 mg for 4 days after chemotherapy    Orders Placed This Encounter  Procedures  . NM PET Image Restag (PS) Skull Base To Thigh    Standing Status: Future     Number of Occurrences:      Standing Expiration Date: 05/09/2016    Order Specific Question:  Reason for Exam (SYMPTOM  OR DIAGNOSIS REQUIRED)    Answer:  staging lymphoma, assess response to Rx    Order Specific Question:  Preferred imaging location?    Answer:  [facility]  All questions were answered. The patient knows to call the clinic with any problems, questions or concerns. No barriers to learning was detected. I spent 15 minutes counseling the patient face to face. The total time spent in the appointment was 20 minutes and more than 50% was on counseling and review of test results     [facility], [place], MD 03/10/2015 10:53 AM

## 2015-03-10 NOTE — Assessment & Plan Note (Signed)
Overall, she tolerated cycle 2 of treatment well. We will resume cycle 3 without dose adjustment. I plan to repeat PET/CT scan before cycle 4 therapy Previously, we reduced the dose of prednisone because of mood swings. She did not feel any difference with the last cycle of treatment. I recommend she resume prednisone at full dose at 60 mg for 4 days after chemotherapy

## 2015-03-10 NOTE — Telephone Encounter (Signed)
per pof to sch pt appt-gave pt copy of avs °

## 2015-03-11 ENCOUNTER — Telehealth: Payer: Self-pay | Admitting: *Deleted

## 2015-03-11 NOTE — Telephone Encounter (Signed)
Per staff message and POF I have scheduled appts. Advised scheduler of appts. JMW  

## 2015-03-12 ENCOUNTER — Ambulatory Visit (HOSPITAL_BASED_OUTPATIENT_CLINIC_OR_DEPARTMENT_OTHER): Payer: Medicare Other

## 2015-03-12 VITALS — BP 132/63 | HR 88 | Temp 98.3°F

## 2015-03-12 DIAGNOSIS — C8338 Diffuse large B-cell lymphoma, lymph nodes of multiple sites: Secondary | ICD-10-CM | POA: Diagnosis not present

## 2015-03-12 DIAGNOSIS — Z5189 Encounter for other specified aftercare: Secondary | ICD-10-CM

## 2015-03-12 MED ORDER — PEGFILGRASTIM INJECTION 6 MG/0.6ML ~~LOC~~
6.0000 mg | PREFILLED_SYRINGE | Freq: Once | SUBCUTANEOUS | Status: AC
  Administered 2015-03-12: 6 mg via SUBCUTANEOUS
  Filled 2015-03-12: qty 0.6

## 2015-03-19 ENCOUNTER — Ambulatory Visit: Payer: Self-pay

## 2015-03-30 ENCOUNTER — Encounter (HOSPITAL_COMMUNITY)
Admission: RE | Admit: 2015-03-30 | Discharge: 2015-03-30 | Disposition: A | Discharge status: Home / Self Care | Payer: Medicare Other | Source: Ambulatory Visit | Attending: Hematology and Oncology | Admitting: Hematology and Oncology

## 2015-03-30 DIAGNOSIS — C8338 Diffuse large B-cell lymphoma, lymph nodes of multiple sites: Secondary | ICD-10-CM | POA: Diagnosis not present

## 2015-03-30 DIAGNOSIS — C8330 Diffuse large B-cell lymphoma, unspecified site: Secondary | ICD-10-CM | POA: Diagnosis not present

## 2015-03-30 LAB — GLUCOSE, CAPILLARY: GLUCOSE-CAPILLARY: 107 mg/dL — AB (ref 65–99)

## 2015-03-30 MED ORDER — FLUDEOXYGLUCOSE F - 18 (FDG) INJECTION
6.8000 | Freq: Once | INTRAVENOUS | Status: AC | PRN
  Administered 2015-03-30: 6.8 via INTRAVENOUS

## 2015-03-31 ENCOUNTER — Telehealth: Payer: Self-pay | Admitting: Hematology and Oncology

## 2015-03-31 ENCOUNTER — Ambulatory Visit: Payer: Medicare Other

## 2015-03-31 ENCOUNTER — Other Ambulatory Visit (HOSPITAL_BASED_OUTPATIENT_CLINIC_OR_DEPARTMENT_OTHER): Payer: Medicare Other

## 2015-03-31 ENCOUNTER — Encounter: Payer: Self-pay | Admitting: Hematology and Oncology

## 2015-03-31 ENCOUNTER — Ambulatory Visit (HOSPITAL_BASED_OUTPATIENT_CLINIC_OR_DEPARTMENT_OTHER): Payer: Medicare Other | Admitting: Hematology and Oncology

## 2015-03-31 ENCOUNTER — Ambulatory Visit (HOSPITAL_BASED_OUTPATIENT_CLINIC_OR_DEPARTMENT_OTHER): Payer: Medicare Other

## 2015-03-31 VITALS — BP 100/63 | HR 93 | Temp 98.2°F | Resp 17 | Ht 65.0 in | Wt 134.7 lb

## 2015-03-31 VITALS — BP 91/54 | HR 81 | Temp 98.4°F | Resp 18

## 2015-03-31 DIAGNOSIS — C8338 Diffuse large B-cell lymphoma, lymph nodes of multiple sites: Secondary | ICD-10-CM

## 2015-03-31 DIAGNOSIS — Z95828 Presence of other vascular implants and grafts: Secondary | ICD-10-CM

## 2015-03-31 DIAGNOSIS — T451X5A Adverse effect of antineoplastic and immunosuppressive drugs, initial encounter: Secondary | ICD-10-CM

## 2015-03-31 DIAGNOSIS — Z5112 Encounter for antineoplastic immunotherapy: Secondary | ICD-10-CM

## 2015-03-31 DIAGNOSIS — D6481 Anemia due to antineoplastic chemotherapy: Secondary | ICD-10-CM | POA: Insufficient documentation

## 2015-03-31 DIAGNOSIS — E441 Mild protein-calorie malnutrition: Secondary | ICD-10-CM | POA: Diagnosis not present

## 2015-03-31 DIAGNOSIS — Z5111 Encounter for antineoplastic chemotherapy: Secondary | ICD-10-CM | POA: Diagnosis not present

## 2015-03-31 LAB — CBC WITH DIFFERENTIAL/PLATELET
BASO%: 2.5 % — AB (ref 0.0–2.0)
BASOS ABS: 0.1 10*3/uL (ref 0.0–0.1)
EOS%: 6.8 % (ref 0.0–7.0)
Eosinophils Absolute: 0.3 10*3/uL (ref 0.0–0.5)
HCT: 33.8 % — ABNORMAL LOW (ref 34.8–46.6)
HEMOGLOBIN: 11.3 g/dL — AB (ref 11.6–15.9)
LYMPH%: 26.8 % (ref 14.0–49.7)
MCH: 30 pg (ref 25.1–34.0)
MCHC: 33.4 g/dL (ref 31.5–36.0)
MCV: 89.7 fL (ref 79.5–101.0)
MONO#: 0.6 10*3/uL (ref 0.1–0.9)
MONO%: 15.2 % — AB (ref 0.0–14.0)
NEUT%: 48.7 % (ref 38.4–76.8)
NEUTROS ABS: 1.9 10*3/uL (ref 1.5–6.5)
NRBC: 0 % (ref 0–0)
Platelets: 338 10*3/uL (ref 145–400)
RBC: 3.77 10*6/uL (ref 3.70–5.45)
RDW: 16 % — ABNORMAL HIGH (ref 11.2–14.5)
WBC: 4 10*3/uL (ref 3.9–10.3)
lymph#: 1.1 10*3/uL (ref 0.9–3.3)

## 2015-03-31 LAB — COMPREHENSIVE METABOLIC PANEL
ALBUMIN: 3.4 g/dL — AB (ref 3.5–5.0)
ALK PHOS: 78 U/L (ref 40–150)
ALT: 15 U/L (ref 0–55)
AST: 16 U/L (ref 5–34)
Anion Gap: 7 mEq/L (ref 3–11)
BILIRUBIN TOTAL: 0.33 mg/dL (ref 0.20–1.20)
BUN: 11.1 mg/dL (ref 7.0–26.0)
CO2: 25 meq/L (ref 22–29)
Calcium: 9.1 mg/dL (ref 8.4–10.4)
Chloride: 108 mEq/L (ref 98–109)
Creatinine: 0.7 mg/dL (ref 0.6–1.1)
EGFR: 86 mL/min/{1.73_m2} — AB (ref >90)
GLUCOSE: 95 mg/dL (ref 70–140)
Potassium: 4.2 mEq/L (ref 3.5–5.1)
SODIUM: 140 meq/L (ref 136–145)
TOTAL PROTEIN: 6.2 g/dL — AB (ref 6.4–8.3)

## 2015-03-31 MED ORDER — ACETAMINOPHEN 325 MG PO TABS
650.0000 mg | ORAL_TABLET | Freq: Once | ORAL | Status: AC
  Administered 2015-03-31: 650 mg via ORAL

## 2015-03-31 MED ORDER — SODIUM CHLORIDE 0.9% FLUSH
10.0000 mL | INTRAVENOUS | Status: DC | PRN
  Administered 2015-03-31: 10 mL via INTRAVENOUS
  Filled 2015-03-31: qty 10

## 2015-03-31 MED ORDER — RITUXIMAB CHEMO INJECTION 500 MG/50ML
375.0000 mg/m2 | Freq: Once | INTRAVENOUS | Status: AC
  Administered 2015-03-31: 600 mg via INTRAVENOUS
  Filled 2015-03-31: qty 50

## 2015-03-31 MED ORDER — SODIUM CHLORIDE 0.9 % IV SOLN
Freq: Once | INTRAVENOUS | Status: AC
  Administered 2015-03-31: 09:00:00 via INTRAVENOUS

## 2015-03-31 MED ORDER — DIPHENHYDRAMINE HCL 25 MG PO CAPS
50.0000 mg | ORAL_CAPSULE | Freq: Once | ORAL | Status: AC
  Administered 2015-03-31: 50 mg via ORAL

## 2015-03-31 MED ORDER — DEXAMETHASONE SODIUM PHOSPHATE 100 MG/10ML IJ SOLN
Freq: Once | INTRAMUSCULAR | Status: AC
  Administered 2015-03-31: 09:00:00 via INTRAVENOUS
  Filled 2015-03-31: qty 8

## 2015-03-31 MED ORDER — DIPHENHYDRAMINE HCL 25 MG PO CAPS
ORAL_CAPSULE | ORAL | Status: AC
  Filled 2015-03-31: qty 2

## 2015-03-31 MED ORDER — HEPARIN SOD (PORK) LOCK FLUSH 100 UNIT/ML IV SOLN
500.0000 [IU] | Freq: Once | INTRAVENOUS | Status: AC | PRN
  Administered 2015-03-31: 500 [IU]
  Filled 2015-03-31: qty 5

## 2015-03-31 MED ORDER — SODIUM CHLORIDE 0.9 % IJ SOLN
10.0000 mL | INTRAMUSCULAR | Status: DC | PRN
  Administered 2015-03-31: 10 mL
  Filled 2015-03-31: qty 10

## 2015-03-31 MED ORDER — VINCRISTINE SULFATE CHEMO INJECTION 1 MG/ML
2.0000 mg | Freq: Once | INTRAVENOUS | Status: AC
  Administered 2015-03-31: 2 mg via INTRAVENOUS
  Filled 2015-03-31: qty 2

## 2015-03-31 MED ORDER — DOXORUBICIN HCL CHEMO IV INJECTION 2 MG/ML
50.0000 mg/m2 | Freq: Once | INTRAVENOUS | Status: AC
  Administered 2015-03-31: 86 mg via INTRAVENOUS
  Filled 2015-03-31: qty 43

## 2015-03-31 MED ORDER — ACETAMINOPHEN 325 MG PO TABS
ORAL_TABLET | ORAL | Status: AC
  Filled 2015-03-31: qty 2

## 2015-03-31 MED ORDER — SODIUM CHLORIDE 0.9 % IV SOLN
750.0000 mg/m2 | Freq: Once | INTRAVENOUS | Status: AC
  Administered 2015-03-31: 1280 mg via INTRAVENOUS
  Filled 2015-03-31: qty 64

## 2015-03-31 NOTE — Assessment & Plan Note (Signed)
Overall, she tolerated treatment well. We will resume cycle 4 without dose adjustment. Recent PET/CT scan showed positive response to therapy We will repeat another imaging study after cycle 6 of treatment.

## 2015-03-31 NOTE — Telephone Encounter (Signed)
Gave and printed appt sched and avs for pt for march and April  °

## 2015-03-31 NOTE — Patient Instructions (Signed)
[facility] Discharge Instructions for Patients Receiving Chemotherapy  Today you received the following chemotherapy agents rituxan/cytoxan/adriamycin/vincristine   To help prevent nausea and vomiting after your treatment, we encourage you to take your nausea medication as directed   If you develop nausea and vomiting that is not controlled by your nausea medication, call the clinic.   BELOW ARE SYMPTOMS THAT SHOULD BE REPORTED IMMEDIATELY:  *FEVER GREATER THAN 100.5 F  *CHILLS WITH OR WITHOUT FEVER  NAUSEA AND VOMITING THAT IS NOT CONTROLLED WITH YOUR NAUSEA MEDICATION  *UNUSUAL SHORTNESS OF BREATH  *UNUSUAL BRUISING OR BLEEDING  TENDERNESS IN MOUTH AND THROAT WITH OR WITHOUT PRESENCE OF ULCERS  *URINARY PROBLEMS  *BOWEL PROBLEMS  UNUSUAL RASH Items with * indicate a potential emergency and should be followed up as soon as possible.  Feel free to call the clinic you have any questions or concerns. The clinic phone number is [phone].

## 2015-03-31 NOTE — Assessment & Plan Note (Signed)
This is due to recent treatment She is not symptomatic Observe only 

## 2015-03-31 NOTE — Assessment & Plan Note (Signed)

## 2015-03-31 NOTE — Patient Instructions (Signed)

## 2015-03-31 NOTE — Progress Notes (Signed)
[place] OFFICE PROGRESS NOTE  Patient Care Team: Signe Colt, MD as PCP - General (Obstetrics and Gynecology) Coralie Keens, MD as Consulting Physician (General Surgery)  SUMMARY OF ONCOLOGIC HISTORY:   Diffuse large b-cell lymphoma, lymph nodes of multiple sites [facility])   01/07/2015 Procedure She had excisional biopsy of left axilla   01/07/2015 Pathology Results Accession: [id number] showed follicular and DLBCL   07/19/8919 Imaging PET scan showed stage III disease   01/20/2015 Bone Marrow Biopsy BM biopsy showed normal cytogenetics with possible involvement of low grade lymphoma.  Accession: [id number]   01/22/2015 Imaging ECHo showed normal EF   01/27/2015 -  Chemotherapy She received R-CHOP chemo   02/12/2015 Procedure She had port placement   03/30/2015 Imaging PET CT scan showed positive response to treatment    INTERVAL HISTORY: Please see below for problem oriented charting. She tolerated cycle 3 of chemotherapy well apart from some mild fatigue. Denies neuropathy. No mucositis, nausea or vomiting  REVIEW OF SYSTEMS:   Constitutional: Denies fevers, chills or abnormal weight loss Eyes: Denies blurriness of vision Ears, nose, mouth, throat, and face: Denies mucositis or sore throat Respiratory: Denies cough, dyspnea or wheezes Cardiovascular: Denies palpitation, chest discomfort or lower extremity swelling Gastrointestinal:  Denies nausea, heartburn or change in bowel habits Skin: Denies abnormal skin rashes Lymphatics: Denies new lymphadenopathy or easy bruising Neurological:Denies numbness, tingling or new weaknesses Behavioral/Psych: Mood is stable, no new changes  All other systems were reviewed with the patient and are negative.  I have reviewed the past medical history, past surgical history, social history and family history with the patient and they are unchanged from previous note.  ALLERGIES:  has No Known Allergies.  MEDICATIONS:  Current  Outpatient Prescriptions  Medication Sig Dispense Refill  . allopurinol (ZYLOPRIM) 300 MG tablet Take 1 tablet (300 mg total) by mouth daily. 30 tablet 0  . calcium carbonate (OSCAL) 1500 (600 CA) MG TABS tablet Take by mouth 2 (two) times daily with a meal.    . cetirizine (ZYRTEC) 10 MG tablet Take 10 mg by mouth daily.    Marland Kitchen lidocaine-prilocaine (EMLA) cream Apply to affected area once 30 g 3  . ondansetron (ZOFRAN) 8 MG tablet Take 1 tablet (8 mg total) by mouth every 8 (eight) hours as needed. 30 tablet 1  . predniSONE (DELTASONE) 20 MG tablet Take 3 tablets (60 mg total) by mouth daily. Take on days 2-5 of chemotherapy every 3 weeks 60 tablet 6  . prochlorperazine (COMPAZINE) 10 MG tablet Take 1 tablet (10 mg total) by mouth every 6 (six) hours as needed (Nausea or vomiting). 30 tablet 6   No current facility-administered medications for this visit.    PHYSICAL EXAMINATION: ECOG PERFORMANCE STATUS: 1 - Symptomatic but completely ambulatory  Filed Vitals:   03/31/15 0822  BP: 100/63  Pulse: 93  Temp: 98.2 F (36.8 C)  Resp: 17   Filed Weights   03/31/15 0822  Weight: 134 lb 11.2 oz (61.1 kg)    GENERAL:alert, no distress and comfortable SKIN: skin color, texture, turgor are normal, no rashes or significant lesions EYES: normal, Conjunctiva are pink and non-injected, sclera clear OROPHARYNX:no exudate, no erythema and lips, buccal mucosa, and tongue normal  NECK: supple, thyroid normal size, non-tender, without nodularity LYMPH:  no palpable lymphadenopathy in the cervical, axillary or inguinal LUNGS: clear to auscultation and percussion with normal breathing effort HEART: regular rate & rhythm and no murmurs and no lower extremity edema ABDOMEN:abdomen  soft, non-tender and normal bowel sounds Musculoskeletal:no cyanosis of digits and no clubbing  NEURO: alert & oriented x 3 with fluent speech, no focal motor/sensory deficits  LABORATORY DATA:  I have reviewed the data as  listed    Component Value Date/Time   NA 140 03/31/2015 0739   K 4.2 03/31/2015 0739   CO2 25 03/31/2015 0739   GLUCOSE 95 03/31/2015 0739   BUN 11.1 03/31/2015 0739   CREATININE 0.7 03/31/2015 0739   CALCIUM 9.1 03/31/2015 0739   PROT 6.2* 03/31/2015 0739   ALBUMIN 3.4* 03/31/2015 0739   AST 16 03/31/2015 0739   ALT 15 03/31/2015 0739   ALKPHOS 78 03/31/2015 0739   BILITOT 0.33 03/31/2015 0739    No results found for: SPEP, UPEP  Lab Results  Component Value Date   WBC 4.0 03/31/2015   NEUTROABS 1.9 03/31/2015   HGB 11.3* 03/31/2015   HCT 33.8* 03/31/2015   MCV 89.7 03/31/2015   PLT 338 03/31/2015      Chemistry      Component Value Date/Time   NA 140 03/31/2015 0739   K 4.2 03/31/2015 0739   CO2 25 03/31/2015 0739   BUN 11.1 03/31/2015 0739   CREATININE 0.7 03/31/2015 0739      Component Value Date/Time   CALCIUM 9.1 03/31/2015 0739   ALKPHOS 78 03/31/2015 0739   AST 16 03/31/2015 0739   ALT 15 03/31/2015 0739   BILITOT 0.33 03/31/2015 0739       RADIOGRAPHIC STUDIES: I have personally reviewed the radiological images as listed and agreed with the findings in the report. Nm Pet Image Restag (ps) Skull Base To Thigh  03/30/2015  CLINICAL DATA:  Subsequent treatment strategy for B- cell lymphoma. EXAM: NUCLEAR MEDICINE PET SKULL BASE TO THIGH TECHNIQUE: 6.8 mCi F-18 FDG was injected intravenously. Full-ring PET imaging was performed from the skull base to thigh after the radiotracer. CT data was obtained and used for attenuation correction and anatomic localization. FASTING BLOOD GLUCOSE:  Value: 107 mg/dl COMPARISON:  01/19/2015 FINDINGS: NECK No hypermetabolic lymph nodes in the neck. CHEST Left axillary low-density collection reduced in size, currently 1.8 by 1.3 cm on image 57/4 and previously 3.3 by 2.2 cm, faint metabolic activity with maximum SUV 2.1. Likely seroma. Small precarinal lymph node measuring 1 cm in short axis on image 66 series 4 has maximum  standard uptake value 4.7, previously 4.0. Small right hilar lymph node has maximum standard uptake value 3.8, previously 5.8. Left infrahilar node maximum standard uptake value 4.4, previously 3.5. Background mediastinal blood pool activity maximum SUV is 3.5. ABDOMEN/PELVIS Background hepatic activity maximum SUV is 3.75. Focal mildly increased uptake in the gastric cardia, maximum SUV 5.0, previously 6.0. The focally hypermetabolic lymph node just below the aortic bifurcation on the prior exam is no longer readily apparent. The other focally hypermetabolic lymph node along the left posterior margin of the abdominal aorta on the prior exam is likewise not hypermetabolic today. No abnormal splenic activity. SKELETON Mildly generally accentuated skeletal activity probably reflecting granulocyte stimulation. Levoconvex lumbar scoliosis with rotary component. IMPRESSION: 1. The abdominal nodal activity has resolved. However, there several faintly hypermetabolic lymph nodes in the chest including a precarinal lymph node with maximum standard uptake value 4.7 (previously 4.0) and a left infrahilar node with maximum SUV 4.4, previously 3.5. 2. A small right hilar lymph node has decreased in activity from prior measurement of 5.8 to current SUV max of 3.8. 3. There continues to be some  increased activity in the gastric cardia, currently SUV 5.0, previously 6.0. Most likely this is physiologic activity rather than lymphomatous involvement, but the area has been underdistended on scans making it hard to assess for wall thickening. 4. Mildly accentuated generalized skeletal activity probably from granulocyte stimulation. Electronically Signed   By: Van Clines M.D.   On: 03/30/2015 14:13     ASSESSMENT & PLAN:  Diffuse large b-cell lymphoma, lymph nodes of multiple sites ([place])  Overall, she tolerated treatment well. We will resume cycle 4 without dose adjustment. Recent PET/CT scan showed positive response to  therapy We will repeat another imaging study after cycle 6 of treatment.  Anemia due to antineoplastic chemotherapy This is likely due to recent treatment. The patient denies recent history of bleeding such as epistaxis, hematuria or hematochezia. She is asymptomatic from the anemia. I will observe for now.  She does not require transfusion now. I will continue the chemotherapy at current dose without dosage adjustment.  If the anemia gets progressive worse in the future, I might have to delay her treatment or adjust the chemotherapy dose.   Mild protein-calorie malnutrition ([place]) This is due to recent treatment. She is not symptomatic. Observe only.   No orders of the defined types were placed in this encounter.   All questions were answered. The patient knows to call the clinic with any problems, questions or concerns. No barriers to learning was detected. I spent 25 minutes counseling the patient face to face. The total time spent in the appointment was 30 minutes and more than 50% was on counseling and review of test results     [facility], [place], MD 03/31/2015 4:39 PM

## 2015-04-02 ENCOUNTER — Ambulatory Visit (HOSPITAL_BASED_OUTPATIENT_CLINIC_OR_DEPARTMENT_OTHER): Payer: Medicare Other

## 2015-04-02 VITALS — BP 111/66 | HR 93 | Temp 98.3°F

## 2015-04-02 DIAGNOSIS — C8338 Diffuse large B-cell lymphoma, lymph nodes of multiple sites: Secondary | ICD-10-CM | POA: Diagnosis not present

## 2015-04-02 DIAGNOSIS — Z5189 Encounter for other specified aftercare: Secondary | ICD-10-CM | POA: Diagnosis not present

## 2015-04-02 MED ORDER — PEGFILGRASTIM INJECTION 6 MG/0.6ML ~~LOC~~
6.0000 mg | PREFILLED_SYRINGE | Freq: Once | SUBCUTANEOUS | Status: AC
  Administered 2015-04-02: 6 mg via SUBCUTANEOUS
  Filled 2015-04-02: qty 0.6

## 2015-04-17 ENCOUNTER — Other Ambulatory Visit: Payer: Self-pay | Admitting: Hematology and Oncology

## 2015-04-21 ENCOUNTER — Encounter: Payer: Self-pay | Admitting: Hematology and Oncology

## 2015-04-21 ENCOUNTER — Ambulatory Visit (HOSPITAL_BASED_OUTPATIENT_CLINIC_OR_DEPARTMENT_OTHER): Payer: Medicare Other

## 2015-04-21 ENCOUNTER — Telehealth: Payer: Self-pay | Admitting: Hematology and Oncology

## 2015-04-21 ENCOUNTER — Telehealth: Payer: Self-pay | Admitting: *Deleted

## 2015-04-21 ENCOUNTER — Ambulatory Visit (HOSPITAL_BASED_OUTPATIENT_CLINIC_OR_DEPARTMENT_OTHER): Payer: Medicare Other | Admitting: Hematology and Oncology

## 2015-04-21 ENCOUNTER — Other Ambulatory Visit (HOSPITAL_BASED_OUTPATIENT_CLINIC_OR_DEPARTMENT_OTHER): Payer: Medicare Other

## 2015-04-21 VITALS — BP 107/64 | HR 86 | Temp 98.2°F | Resp 18

## 2015-04-21 VITALS — BP 111/63 | HR 88 | Temp 98.1°F | Resp 20 | Ht 65.0 in | Wt 132.9 lb

## 2015-04-21 DIAGNOSIS — C8338 Diffuse large B-cell lymphoma, lymph nodes of multiple sites: Secondary | ICD-10-CM

## 2015-04-21 DIAGNOSIS — Z5111 Encounter for antineoplastic chemotherapy: Secondary | ICD-10-CM

## 2015-04-21 DIAGNOSIS — D61810 Antineoplastic chemotherapy induced pancytopenia: Secondary | ICD-10-CM | POA: Insufficient documentation

## 2015-04-21 DIAGNOSIS — Z5112 Encounter for antineoplastic immunotherapy: Secondary | ICD-10-CM

## 2015-04-21 DIAGNOSIS — Z452 Encounter for adjustment and management of vascular access device: Secondary | ICD-10-CM | POA: Diagnosis not present

## 2015-04-21 DIAGNOSIS — T451X5A Adverse effect of antineoplastic and immunosuppressive drugs, initial encounter: Secondary | ICD-10-CM

## 2015-04-21 DIAGNOSIS — Z95828 Presence of other vascular implants and grafts: Secondary | ICD-10-CM

## 2015-04-21 LAB — CBC WITH DIFFERENTIAL/PLATELET
BASO%: 4 % — ABNORMAL HIGH (ref 0.0–2.0)
BASOS ABS: 0.2 10*3/uL — AB (ref 0.0–0.1)
EOS ABS: 0.2 10*3/uL (ref 0.0–0.5)
EOS%: 6 % (ref 0.0–7.0)
HCT: 33.5 % — ABNORMAL LOW (ref 34.8–46.6)
HEMOGLOBIN: 11 g/dL — AB (ref 11.6–15.9)
LYMPH#: 0.4 10*3/uL — AB (ref 0.9–3.3)
LYMPH%: 11.9 % — ABNORMAL LOW (ref 14.0–49.7)
MCH: 30.1 pg (ref 25.1–34.0)
MCHC: 32.8 g/dL (ref 31.5–36.0)
MCV: 91.7 fL (ref 79.5–101.0)
MONO#: 1.1 10*3/uL — ABNORMAL HIGH (ref 0.1–0.9)
MONO%: 29.5 % — ABNORMAL HIGH (ref 0.0–14.0)
NEUT%: 48.6 % (ref 38.4–76.8)
NEUTROS ABS: 1.8 10*3/uL (ref 1.5–6.5)
PLATELETS: 370 10*3/uL (ref 145–400)
RBC: 3.65 10*6/uL — AB (ref 3.70–5.45)
RDW: 17.7 % — AB (ref 11.2–14.5)
WBC: 3.8 10*3/uL — AB (ref 3.9–10.3)

## 2015-04-21 LAB — COMPREHENSIVE METABOLIC PANEL
ALBUMIN: 3.4 g/dL — AB (ref 3.5–5.0)
ALK PHOS: 74 U/L (ref 40–150)
ALT: 12 U/L (ref 0–55)
ANION GAP: 8 meq/L (ref 3–11)
AST: 16 U/L (ref 5–34)
BILIRUBIN TOTAL: 0.37 mg/dL (ref 0.20–1.20)
BUN: 12.8 mg/dL (ref 7.0–26.0)
CO2: 27 mEq/L (ref 22–29)
Calcium: 9.2 mg/dL (ref 8.4–10.4)
Chloride: 108 mEq/L (ref 98–109)
Creatinine: 0.7 mg/dL (ref 0.6–1.1)
EGFR: 84 mL/min/{1.73_m2} — AB (ref >90)
Glucose: 87 mg/dl (ref 70–140)
Potassium: 4.2 mEq/L (ref 3.5–5.1)
Sodium: 143 mEq/L (ref 136–145)
TOTAL PROTEIN: 6.1 g/dL — AB (ref 6.4–8.3)

## 2015-04-21 MED ORDER — DIPHENHYDRAMINE HCL 25 MG PO CAPS
ORAL_CAPSULE | ORAL | Status: AC
  Filled 2015-04-21: qty 2

## 2015-04-21 MED ORDER — DOXORUBICIN HCL CHEMO IV INJECTION 2 MG/ML
50.0000 mg/m2 | Freq: Once | INTRAVENOUS | Status: AC
  Administered 2015-04-21: 86 mg via INTRAVENOUS
  Filled 2015-04-21: qty 43

## 2015-04-21 MED ORDER — ALTEPLASE 2 MG IJ SOLR
2.0000 mg | Freq: Once | INTRAMUSCULAR | Status: AC
  Administered 2015-04-21: 2 mg
  Filled 2015-04-21: qty 2

## 2015-04-21 MED ORDER — VINCRISTINE SULFATE CHEMO INJECTION 1 MG/ML
2.0000 mg | Freq: Once | INTRAVENOUS | Status: AC
  Administered 2015-04-21: 2 mg via INTRAVENOUS
  Filled 2015-04-21: qty 2

## 2015-04-21 MED ORDER — DIPHENHYDRAMINE HCL 25 MG PO CAPS
50.0000 mg | ORAL_CAPSULE | Freq: Once | ORAL | Status: AC
  Administered 2015-04-21: 50 mg via ORAL

## 2015-04-21 MED ORDER — ACETAMINOPHEN 325 MG PO TABS
ORAL_TABLET | ORAL | Status: AC
  Filled 2015-04-21: qty 2

## 2015-04-21 MED ORDER — SODIUM CHLORIDE 0.9 % IJ SOLN
10.0000 mL | INTRAMUSCULAR | Status: DC | PRN
  Administered 2015-04-21: 10 mL
  Filled 2015-04-21: qty 10

## 2015-04-21 MED ORDER — SODIUM CHLORIDE 0.9 % IV SOLN
Freq: Once | INTRAVENOUS | Status: AC
  Administered 2015-04-21: 09:00:00 via INTRAVENOUS
  Filled 2015-04-21: qty 8

## 2015-04-21 MED ORDER — HEPARIN SOD (PORK) LOCK FLUSH 100 UNIT/ML IV SOLN
500.0000 [IU] | Freq: Once | INTRAVENOUS | Status: AC | PRN
  Administered 2015-04-21: 500 [IU]
  Filled 2015-04-21: qty 5

## 2015-04-21 MED ORDER — SODIUM CHLORIDE 0.9% FLUSH
10.0000 mL | INTRAVENOUS | Status: DC | PRN
  Filled 2015-04-21: qty 10

## 2015-04-21 MED ORDER — SODIUM CHLORIDE 0.9 % IV SOLN
375.0000 mg/m2 | Freq: Once | INTRAVENOUS | Status: AC
  Administered 2015-04-21: 600 mg via INTRAVENOUS
  Filled 2015-04-21: qty 50

## 2015-04-21 MED ORDER — ACETAMINOPHEN 325 MG PO TABS
650.0000 mg | ORAL_TABLET | Freq: Once | ORAL | Status: AC
  Administered 2015-04-21: 650 mg via ORAL

## 2015-04-21 MED ORDER — SODIUM CHLORIDE 0.9 % IV SOLN
Freq: Once | INTRAVENOUS | Status: AC
  Administered 2015-04-21: 09:00:00 via INTRAVENOUS

## 2015-04-21 MED ORDER — HEPARIN SOD (PORK) LOCK FLUSH 100 UNIT/ML IV SOLN
500.0000 [IU] | Freq: Once | INTRAVENOUS | Status: DC
  Filled 2015-04-21: qty 5

## 2015-04-21 MED ORDER — SODIUM CHLORIDE 0.9 % IV SOLN
750.0000 mg/m2 | Freq: Once | INTRAVENOUS | Status: AC
  Administered 2015-04-21: 1280 mg via INTRAVENOUS
  Filled 2015-04-21: qty 64

## 2015-04-21 NOTE — Progress Notes (Signed)
Port accessed, flushed without problems. Only slight blood return noted. Cathflo placed

## 2015-04-21 NOTE — Assessment & Plan Note (Signed)
Overall, she tolerated treatment well. We will continue cycle 5 without dose adjustment. Recent PET/CT scan showed positive response to therapy We will repeat another imaging study after cycle 6 of treatment.

## 2015-04-21 NOTE — Patient Instructions (Signed)
[facility] Discharge Instructions for Patients Receiving Chemotherapy  Today you received the following chemotherapy agents: Adriamycin, Vincristine, Cytoxan and Rituxan   To help prevent nausea and vomiting after your treatment, we encourage you to take your nausea medication as directed.    If you develop nausea and vomiting that is not controlled by your nausea medication, call the clinic.   BELOW ARE SYMPTOMS THAT SHOULD BE REPORTED IMMEDIATELY:  *FEVER GREATER THAN 100.5 F  *CHILLS WITH OR WITHOUT FEVER  NAUSEA AND VOMITING THAT IS NOT CONTROLLED WITH YOUR NAUSEA MEDICATION  *UNUSUAL SHORTNESS OF BREATH  *UNUSUAL BRUISING OR BLEEDING  TENDERNESS IN MOUTH AND THROAT WITH OR WITHOUT PRESENCE OF ULCERS  *URINARY PROBLEMS  *BOWEL PROBLEMS  UNUSUAL RASH Items with * indicate a potential emergency and should be followed up as soon as possible.  Feel free to call the clinic you have any questions or concerns. The clinic phone number is [phone].  Please show the CHEMO ALERT CARD at check-in to the Emergency Department and triage nurse.   

## 2015-04-21 NOTE — Telephone Encounter (Signed)
Per staff message and POF I have scheduled appts. Advised scheduler of appts. JMW  

## 2015-04-21 NOTE — Progress Notes (Signed)
[place] OFFICE PROGRESS NOTE  Patient Care Team: Signe Colt, MD as PCP - General (Obstetrics and Gynecology) Coralie Keens, MD as Consulting Physician (General Surgery)  SUMMARY OF ONCOLOGIC HISTORY:   Diffuse large b-cell lymphoma, lymph nodes of multiple sites [facility])   01/07/2015 Procedure She had excisional biopsy of left axilla   01/07/2015 Pathology Results Accession: [id number] showed follicular and DLBCL   1/0/2725 Imaging PET scan showed stage III disease   01/20/2015 Bone Marrow Biopsy BM biopsy showed normal cytogenetics with possible involvement of low grade lymphoma.  Accession: [id number]   01/22/2015 Imaging ECHo showed normal EF   01/27/2015 -  Chemotherapy She received R-CHOP chemo   02/12/2015 Procedure She had port placement   03/30/2015 Imaging PET CT scan showed positive response to treatment    INTERVAL HISTORY: Please see below for problem oriented charting. She returns for cycle 5 of chemotherapy. Denies side effects of treatment apart from fatigue. No mucositis, nausea, vomiting or neuropathy.  REVIEW OF SYSTEMS:   Constitutional: Denies fevers, chills or abnormal weight loss Eyes: Denies blurriness of vision Ears, nose, mouth, throat, and face: Denies mucositis or sore throat Respiratory: Denies cough, dyspnea or wheezes Cardiovascular: Denies palpitation, chest discomfort or lower extremity swelling Gastrointestinal:  Denies nausea, heartburn or change in bowel habits Skin: Denies abnormal skin rashes Lymphatics: Denies new lymphadenopathy or easy bruising Neurological:Denies numbness, tingling or new weaknesses Behavioral/Psych: Mood is stable, no new changes  All other systems were reviewed with the patient and are negative.  I have reviewed the past medical history, past surgical history, social history and family history with the patient and they are unchanged from previous note.  ALLERGIES:  has No Known  Allergies.  MEDICATIONS:  Current Outpatient Prescriptions  Medication Sig Dispense Refill  . allopurinol (ZYLOPRIM) 300 MG tablet Take 1 tablet (300 mg total) by mouth daily. 30 tablet 0  . calcium carbonate (OSCAL) 1500 (600 CA) MG TABS tablet Take by mouth 2 (two) times daily with a meal.    . cetirizine (ZYRTEC) 10 MG tablet Take 10 mg by mouth daily.    Marland Kitchen lidocaine-prilocaine (EMLA) cream Apply to affected area once 30 g 3  . ondansetron (ZOFRAN) 8 MG tablet Take 1 tablet (8 mg total) by mouth every 8 (eight) hours as needed. 30 tablet 1  . predniSONE (DELTASONE) 20 MG tablet Take 3 tablets (60 mg total) by mouth daily. Take on days 2-5 of chemotherapy every 3 weeks 60 tablet 6  . prochlorperazine (COMPAZINE) 10 MG tablet Take 1 tablet (10 mg total) by mouth every 6 (six) hours as needed (Nausea or vomiting). 30 tablet 6   No current facility-administered medications for this visit.    PHYSICAL EXAMINATION: ECOG PERFORMANCE STATUS: 0 - Asymptomatic  Filed Vitals:   04/21/15 0840  BP: 111/63  Pulse: 88  Temp: 98.1 F (36.7 C)  Resp: 20   Filed Weights   04/21/15 0840  Weight: 132 lb 14.4 oz (60.283 kg)    GENERAL:alert, no distress and comfortable SKIN: skin color, texture, turgor are normal, no rashes or significant lesions EYES: normal, Conjunctiva are pink and non-injected, sclera clear OROPHARYNX:no exudate, no erythema and lips, buccal mucosa, and tongue normal  NECK: supple, thyroid normal size, non-tender, without nodularity LYMPH:  no palpable lymphadenopathy in the cervical, axillary or inguinal LUNGS: clear to auscultation and percussion with normal breathing effort HEART: regular rate & rhythm and no murmurs and no lower extremity edema ABDOMEN:abdomen soft,  non-tender and normal bowel sounds Musculoskeletal:no cyanosis of digits and no clubbing  NEURO: alert & oriented x 3 with fluent speech, no focal motor/sensory deficits  LABORATORY DATA:  I have  reviewed the data as listed    Component Value Date/Time   NA 140 03/31/2015 0739   K 4.2 03/31/2015 0739   CO2 25 03/31/2015 0739   GLUCOSE 95 03/31/2015 0739   BUN 11.1 03/31/2015 0739   CREATININE 0.7 03/31/2015 0739   CALCIUM 9.1 03/31/2015 0739   PROT 6.2* 03/31/2015 0739   ALBUMIN 3.4* 03/31/2015 0739   AST 16 03/31/2015 0739   ALT 15 03/31/2015 0739   ALKPHOS 78 03/31/2015 0739   BILITOT 0.33 03/31/2015 0739    No results found for: SPEP, UPEP  Lab Results  Component Value Date   WBC 3.8* 04/21/2015   NEUTROABS 1.8 04/21/2015   HGB 11.0* 04/21/2015   HCT 33.5* 04/21/2015   MCV 91.7 04/21/2015   PLT 370 04/21/2015      Chemistry      Component Value Date/Time   NA 140 03/31/2015 0739   K 4.2 03/31/2015 0739   CO2 25 03/31/2015 0739   BUN 11.1 03/31/2015 0739   CREATININE 0.7 03/31/2015 0739      Component Value Date/Time   CALCIUM 9.1 03/31/2015 0739   ALKPHOS 78 03/31/2015 0739   AST 16 03/31/2015 0739   ALT 15 03/31/2015 0739   BILITOT 0.33 03/31/2015 0739      ASSESSMENT & PLAN:  Diffuse large b-cell lymphoma, lymph nodes of multiple sites ([place])  Overall, she tolerated treatment well. We will continue cycle 5 without dose adjustment. Recent PET/CT scan showed positive response to therapy We will repeat another imaging study after cycle 6 of treatment.    Pancytopenia due to antineoplastic chemotherapy [facility]) This is likely due to recent treatment. The patient denies recent history of bleeding such as epistaxis, hematuria or hematochezia. She is asymptomatic from the anemia. I will observe for now. I will continue the chemotherapy at current dose without dosage adjustment.  If the anemia gets progressive worse in the future, I might have to delay her treatment or adjust the chemotherapy dose.      No orders of the defined types were placed in this encounter.   All questions were answered. The patient knows to call the clinic with any problems,  questions or concerns. No barriers to learning was detected. I spent 15 minutes counseling the patient face to face. The total time spent in the appointment was 20 minutes and more than 50% was on counseling and review of test results     [facility], [place], MD 04/21/2015 8:51 AM

## 2015-04-21 NOTE — Assessment & Plan Note (Signed)
This is likely due to recent treatment. The patient denies recent history of bleeding such as epistaxis, hematuria or hematochezia. She is asymptomatic from the anemia. I will observe for now. I will continue the chemotherapy at current dose without dosage adjustment.  If the anemia gets progressive worse in the future, I might have to delay her treatment or adjust the chemotherapy dose.

## 2015-04-21 NOTE — Telephone Encounter (Signed)
per pof to sch pt appt-sent MW emailto sch trmt-pt to getupdated copy b4 leaving** °

## 2015-04-21 NOTE — Progress Notes (Signed)
Cathflo removed from [facility] via 3cc of blood, good and brisk blood return upon half hour check. Flushing and infusing well.

## 2015-04-23 ENCOUNTER — Ambulatory Visit (HOSPITAL_BASED_OUTPATIENT_CLINIC_OR_DEPARTMENT_OTHER): Payer: Medicare Other

## 2015-04-23 VITALS — BP 109/63 | HR 92 | Temp 98.4°F

## 2015-04-23 DIAGNOSIS — Z5189 Encounter for other specified aftercare: Secondary | ICD-10-CM

## 2015-04-23 DIAGNOSIS — C8338 Diffuse large B-cell lymphoma, lymph nodes of multiple sites: Secondary | ICD-10-CM

## 2015-04-23 MED ORDER — PEGFILGRASTIM INJECTION 6 MG/0.6ML ~~LOC~~
6.0000 mg | PREFILLED_SYRINGE | Freq: Once | SUBCUTANEOUS | Status: AC
  Administered 2015-04-23: 6 mg via SUBCUTANEOUS
  Filled 2015-04-23: qty 0.6

## 2015-05-13 ENCOUNTER — Encounter: Payer: Self-pay | Admitting: Hematology and Oncology

## 2015-05-13 ENCOUNTER — Ambulatory Visit (HOSPITAL_BASED_OUTPATIENT_CLINIC_OR_DEPARTMENT_OTHER): Payer: Medicare Other

## 2015-05-13 ENCOUNTER — Telehealth: Payer: Self-pay | Admitting: Hematology and Oncology

## 2015-05-13 ENCOUNTER — Other Ambulatory Visit (HOSPITAL_BASED_OUTPATIENT_CLINIC_OR_DEPARTMENT_OTHER): Payer: Medicare Other

## 2015-05-13 ENCOUNTER — Ambulatory Visit (HOSPITAL_BASED_OUTPATIENT_CLINIC_OR_DEPARTMENT_OTHER): Payer: Medicare Other | Admitting: Hematology and Oncology

## 2015-05-13 ENCOUNTER — Ambulatory Visit: Payer: Medicare Other

## 2015-05-13 VITALS — BP 109/63 | HR 90 | Temp 98.6°F | Resp 18 | Wt 130.7 lb

## 2015-05-13 VITALS — BP 104/64 | HR 81 | Temp 98.3°F | Resp 16

## 2015-05-13 DIAGNOSIS — C8338 Diffuse large B-cell lymphoma, lymph nodes of multiple sites: Secondary | ICD-10-CM

## 2015-05-13 DIAGNOSIS — Z5111 Encounter for antineoplastic chemotherapy: Secondary | ICD-10-CM

## 2015-05-13 DIAGNOSIS — T451X5A Adverse effect of antineoplastic and immunosuppressive drugs, initial encounter: Secondary | ICD-10-CM

## 2015-05-13 DIAGNOSIS — Z452 Encounter for adjustment and management of vascular access device: Secondary | ICD-10-CM

## 2015-05-13 DIAGNOSIS — Z95828 Presence of other vascular implants and grafts: Secondary | ICD-10-CM

## 2015-05-13 DIAGNOSIS — D6481 Anemia due to antineoplastic chemotherapy: Secondary | ICD-10-CM

## 2015-05-13 DIAGNOSIS — R432 Parageusia: Secondary | ICD-10-CM

## 2015-05-13 DIAGNOSIS — R5383 Other fatigue: Secondary | ICD-10-CM | POA: Diagnosis not present

## 2015-05-13 DIAGNOSIS — Z5112 Encounter for antineoplastic immunotherapy: Secondary | ICD-10-CM

## 2015-05-13 LAB — CBC WITH DIFFERENTIAL/PLATELET
BASO%: 1.6 % (ref 0.0–2.0)
Basophils Absolute: 0.1 10*3/uL (ref 0.0–0.1)
EOS ABS: 0.1 10*3/uL (ref 0.0–0.5)
EOS%: 1.6 % (ref 0.0–7.0)
HCT: 33.8 % — ABNORMAL LOW (ref 34.8–46.6)
HEMOGLOBIN: 11 g/dL — AB (ref 11.6–15.9)
LYMPH%: 20.4 % (ref 14.0–49.7)
MCH: 30.9 pg (ref 25.1–34.0)
MCHC: 32.5 g/dL (ref 31.5–36.0)
MCV: 94.9 fL (ref 79.5–101.0)
MONO#: 0.5 10*3/uL (ref 0.1–0.9)
MONO%: 12 % (ref 0.0–14.0)
NEUT%: 64.4 % (ref 38.4–76.8)
NEUTROS ABS: 2.8 10*3/uL (ref 1.5–6.5)
Platelets: 309 10*3/uL (ref 145–400)
RBC: 3.56 10*6/uL — AB (ref 3.70–5.45)
RDW: 15.4 % — AB (ref 11.2–14.5)
WBC: 4.3 10*3/uL (ref 3.9–10.3)
lymph#: 0.9 10*3/uL (ref 0.9–3.3)

## 2015-05-13 LAB — COMPREHENSIVE METABOLIC PANEL
ALBUMIN: 3.7 g/dL (ref 3.5–5.0)
ALK PHOS: 73 U/L (ref 40–150)
ALT: 22 U/L (ref 0–55)
AST: 21 U/L (ref 5–34)
Anion Gap: 9 mEq/L (ref 3–11)
BILIRUBIN TOTAL: 0.55 mg/dL (ref 0.20–1.20)
BUN: 12 mg/dL (ref 7.0–26.0)
CO2: 26 meq/L (ref 22–29)
CREATININE: 0.7 mg/dL (ref 0.6–1.1)
Calcium: 9.3 mg/dL (ref 8.4–10.4)
Chloride: 108 mEq/L (ref 98–109)
EGFR: 80 mL/min/{1.73_m2} — AB (ref >90)
GLUCOSE: 96 mg/dL (ref 70–140)
Potassium: 4 mEq/L (ref 3.5–5.1)
SODIUM: 143 meq/L (ref 136–145)
TOTAL PROTEIN: 6.1 g/dL — AB (ref 6.4–8.3)

## 2015-05-13 MED ORDER — DIPHENHYDRAMINE HCL 25 MG PO CAPS
50.0000 mg | ORAL_CAPSULE | Freq: Once | ORAL | Status: AC
  Administered 2015-05-13: 50 mg via ORAL

## 2015-05-13 MED ORDER — ACETAMINOPHEN 325 MG PO TABS
650.0000 mg | ORAL_TABLET | Freq: Once | ORAL | Status: AC
  Administered 2015-05-13: 650 mg via ORAL

## 2015-05-13 MED ORDER — SODIUM CHLORIDE 0.9 % IJ SOLN
10.0000 mL | INTRAMUSCULAR | Status: DC | PRN
  Administered 2015-05-13: 10 mL
  Filled 2015-05-13: qty 10

## 2015-05-13 MED ORDER — DEXAMETHASONE SODIUM PHOSPHATE 100 MG/10ML IJ SOLN
Freq: Once | INTRAMUSCULAR | Status: AC
  Administered 2015-05-13: 10:00:00 via INTRAVENOUS
  Filled 2015-05-13: qty 8

## 2015-05-13 MED ORDER — HEPARIN SOD (PORK) LOCK FLUSH 100 UNIT/ML IV SOLN
500.0000 [IU] | Freq: Once | INTRAVENOUS | Status: AC | PRN
  Administered 2015-05-13: 500 [IU]
  Filled 2015-05-13: qty 5

## 2015-05-13 MED ORDER — DOXORUBICIN HCL CHEMO IV INJECTION 2 MG/ML
50.0000 mg/m2 | Freq: Once | INTRAVENOUS | Status: AC
  Administered 2015-05-13: 86 mg via INTRAVENOUS
  Filled 2015-05-13: qty 43

## 2015-05-13 MED ORDER — SODIUM CHLORIDE 0.9 % IV SOLN
750.0000 mg/m2 | Freq: Once | INTRAVENOUS | Status: AC
  Administered 2015-05-13: 1280 mg via INTRAVENOUS
  Filled 2015-05-13: qty 64

## 2015-05-13 MED ORDER — SODIUM CHLORIDE 0.9 % IV SOLN
375.0000 mg/m2 | Freq: Once | INTRAVENOUS | Status: AC
  Administered 2015-05-13: 600 mg via INTRAVENOUS
  Filled 2015-05-13: qty 50

## 2015-05-13 MED ORDER — DIPHENHYDRAMINE HCL 25 MG PO CAPS
ORAL_CAPSULE | ORAL | Status: AC
  Filled 2015-05-13: qty 2

## 2015-05-13 MED ORDER — ACETAMINOPHEN 325 MG PO TABS
ORAL_TABLET | ORAL | Status: AC
  Filled 2015-05-13: qty 2

## 2015-05-13 MED ORDER — ALTEPLASE 2 MG IJ SOLR
2.0000 mg | Freq: Once | INTRAMUSCULAR | Status: AC | PRN
  Administered 2015-05-13: 2 mg
  Filled 2015-05-13: qty 2

## 2015-05-13 MED ORDER — VINCRISTINE SULFATE CHEMO INJECTION 1 MG/ML
2.0000 mg | Freq: Once | INTRAVENOUS | Status: AC
  Administered 2015-05-13: 2 mg via INTRAVENOUS
  Filled 2015-05-13: qty 2

## 2015-05-13 MED ORDER — ALTEPLASE 2 MG IJ SOLR
2.0000 mg | Freq: Once | INTRAMUSCULAR | Status: DC | PRN
  Filled 2015-05-13: qty 2

## 2015-05-13 MED ORDER — SODIUM CHLORIDE 0.9 % IV SOLN
Freq: Once | INTRAVENOUS | Status: AC
  Administered 2015-05-13: 10:00:00 via INTRAVENOUS

## 2015-05-13 MED ORDER — SODIUM CHLORIDE 0.9 % IJ SOLN
10.0000 mL | INTRAMUSCULAR | Status: DC | PRN
  Filled 2015-05-13: qty 10

## 2015-05-13 MED ORDER — PREDNISONE 20 MG PO TABS: 60.0000 mg | ORAL_TABLET | Freq: Every day | ORAL | Status: DC

## 2015-05-13 NOTE — Assessment & Plan Note (Signed)
The port is not giving blood return. Alteplase is instilled If next scan is negative, will get the port removed

## 2015-05-13 NOTE — Assessment & Plan Note (Signed)
She complained of persistent fatigue after chemotherapy which is not unexpected. She is only mildly anemic. I suspect her energy level would improve in the near future. I will observe for now.

## 2015-05-13 NOTE — Telephone Encounter (Signed)
per pof to sch pt appt-gave pt copy of avs-adv [facility] will call to sch scan

## 2015-05-13 NOTE — Progress Notes (Signed)
[place] OFFICE PROGRESS NOTE  Patient Care Team: Signe Colt, MD as PCP - General (Obstetrics and Gynecology) Coralie Keens, MD as Consulting Physician (General Surgery)  SUMMARY OF ONCOLOGIC HISTORY:   Diffuse large b-cell lymphoma, lymph nodes of multiple sites [facility])   01/07/2015 Procedure She had excisional biopsy of left axilla   01/07/2015 Pathology Results Accession: [id number] showed follicular and DLBCL   06/11/9507 Imaging PET scan showed stage III disease   01/20/2015 Bone Marrow Biopsy BM biopsy showed normal cytogenetics with possible involvement of low grade lymphoma.  Accession: [id number]   01/22/2015 Imaging ECHo showed normal EF   01/27/2015 -  Chemotherapy She received R-CHOP chemo   02/12/2015 Procedure She had port placement   03/30/2015 Imaging PET CT scan showed positive response to treatment    INTERVAL HISTORY: Please see below for problem oriented charting. She returns prior to cycle 6 of treatment. She complained of fatigue and dysgeusia. Denies nausea or vomiting. Denies peripheral neuropathy. The port is not working this morning and alteplase has been instilled.  REVIEW OF SYSTEMS:   Constitutional: Denies fevers, chills or abnormal weight loss Eyes: Denies blurriness of vision Ears, nose, mouth, throat, and face: Denies mucositis or sore throat Respiratory: Denies cough, dyspnea or wheezes Cardiovascular: Denies palpitation, chest discomfort or lower extremity swelling Gastrointestinal:  Denies nausea, heartburn or change in bowel habits Skin: Denies abnormal skin rashes Lymphatics: Denies new lymphadenopathy or easy bruising Neurological:Denies numbness, tingling or new weaknesses Behavioral/Psych: Mood is stable, no new changes  All other systems were reviewed with the patient and are negative.  I have reviewed the past medical history, past surgical history, social history and family history with the patient and they are  unchanged from previous note.  ALLERGIES:  has No Known Allergies.  MEDICATIONS:  Current Outpatient Prescriptions  Medication Sig Dispense Refill  . calcium carbonate (OSCAL) 1500 (600 CA) MG TABS tablet Take by mouth 2 (two) times daily with a meal.    . cetirizine (ZYRTEC) 10 MG tablet Take 10 mg by mouth daily.    Marland Kitchen lidocaine-prilocaine (EMLA) cream Apply to affected area once 30 g 3  . ondansetron (ZOFRAN) 8 MG tablet Take 1 tablet (8 mg total) by mouth every 8 (eight) hours as needed. 30 tablet 1  . predniSONE (DELTASONE) 20 MG tablet Take 3 tablets (60 mg total) by mouth daily. Take on days 2-5 of chemotherapy every 3 weeks 9 tablet 0  . prochlorperazine (COMPAZINE) 10 MG tablet Take 1 tablet (10 mg total) by mouth every 6 (six) hours as needed (Nausea or vomiting). 30 tablet 6   No current facility-administered medications for this visit.   Facility-Administered Medications Ordered in Other Visits  Medication Dose Route Frequency Provider Last Rate Last Dose  . sodium chloride 0.9 % injection 10 mL  10 mL Intravenous PRN Heath Lark, MD        PHYSICAL EXAMINATION: ECOG PERFORMANCE STATUS: 1 - Symptomatic but completely ambulatory  Filed Vitals:   05/13/15 0817  BP: 109/63  Pulse: 90  Temp: 98.6 F (37 C)  Resp: 18   Filed Weights   05/13/15 0817  Weight: 130 lb 11.2 oz (59.285 kg)    GENERAL:alert, no distress and comfortable SKIN: skin color, texture, turgor are normal, no rashes or significant lesions EYES: normal, Conjunctiva are pink and non-injected, sclera clear OROPHARYNX:no exudate, no erythema and lips, buccal mucosa, and tongue normal  NECK: supple, thyroid normal size, non-tender, without nodularity LYMPH:  no palpable lymphadenopathy in the cervical, axillary or inguinal LUNGS: clear to auscultation and percussion with normal breathing effort HEART: regular rate & rhythm and no murmurs and no lower extremity edema ABDOMEN:abdomen soft, non-tender and  normal bowel sounds Musculoskeletal:no cyanosis of digits and no clubbing  NEURO: alert & oriented x 3 with fluent speech, no focal motor/sensory deficits  LABORATORY DATA:  I have reviewed the data as listed    Component Value Date/Time   NA 143 04/21/2015 0740   K 4.2 04/21/2015 0740   CO2 27 04/21/2015 0740   GLUCOSE 87 04/21/2015 0740   BUN 12.8 04/21/2015 0740   CREATININE 0.7 04/21/2015 0740   CALCIUM 9.2 04/21/2015 0740   PROT 6.1* 04/21/2015 0740   ALBUMIN 3.4* 04/21/2015 0740   AST 16 04/21/2015 0740   ALT 12 04/21/2015 0740   ALKPHOS 74 04/21/2015 0740   BILITOT 0.37 04/21/2015 0740    No results found for: SPEP, UPEP  Lab Results  Component Value Date   WBC 4.3 05/13/2015   NEUTROABS 2.8 05/13/2015   HGB 11.0* 05/13/2015   HCT 33.8* 05/13/2015   MCV 94.9 05/13/2015   PLT 309 05/13/2015      Chemistry      Component Value Date/Time   NA 143 04/21/2015 0740   K 4.2 04/21/2015 0740   CO2 27 04/21/2015 0740   BUN 12.8 04/21/2015 0740   CREATININE 0.7 04/21/2015 0740      Component Value Date/Time   CALCIUM 9.2 04/21/2015 0740   ALKPHOS 74 04/21/2015 0740   AST 16 04/21/2015 0740   ALT 12 04/21/2015 0740   BILITOT 0.37 04/21/2015 0740      ASSESSMENT & PLAN:  Diffuse large b-cell lymphoma, lymph nodes of multiple sites ([place])  Overall, she tolerated treatment well. We will continue cycle 6 without dose adjustment. Recent PET/CT scan showed positive response to therapy We will repeat another imaging study after cycle 6 of treatment.  Anemia due to antineoplastic chemotherapy This is likely due to recent treatment. The patient denies recent history of bleeding such as epistaxis, hematuria or hematochezia. She is asymptomatic from the anemia. I will observe for now.  She does not require transfusion now. I will continue the chemotherapy at current dose without dosage adjustment.  If the anemia gets progressive worse in the future, I might have to delay  her treatment or adjust the chemotherapy dose.   Port catheter in place The port is not giving blood return. Alteplase is instilled If next scan is negative, will get the port removed  Dysgeusia This is a common side effects of treatment. It should improve in the near future.  Other fatigue She complained of persistent fatigue after chemotherapy which is not unexpected. She is only mildly anemic. I suspect her energy level would improve in the near future. I will observe for now.   Orders Placed This Encounter  Procedures  . NM PET Image Restag (PS) Skull Base To Thigh    Standing Status: Future     Number of Occurrences:      Standing Expiration Date: 07/12/2016    Order Specific Question:  Reason for Exam (SYMPTOM  OR DIAGNOSIS REQUIRED)    Answer:  staging lymphoma assess response to Rx    Order Specific Question:  Preferred imaging location?    Answer:  [facility]   All questions were answered. The patient knows to call the clinic with any problems, questions or concerns. No barriers to learning  was detected. I spent 20 minutes counseling the patient face to face. The total time spent in the appointment was 25 minutes and more than 50% was on counseling and review of test results     [facility], [place], MD 05/13/2015 9:12 AM

## 2015-05-13 NOTE — Progress Notes (Signed)
Unable to obtain blood from [place].  TPA instilled at 850 am.  Desk nurse notified.

## 2015-05-13 NOTE — Assessment & Plan Note (Addendum)
This is a common side effects of treatment. It should improve in the near future.

## 2015-05-13 NOTE — Patient Instructions (Signed)
[place] Discharge Instructions for Patients Receiving Chemotherapy  Today you received the following chemotherapy agents:  Adriamycin, Vincristine, Cytoxan, and Rituxan.  To help prevent nausea and vomiting after your treatment, we encourage you to take your nausea medication as prescribed.   If you develop nausea and vomiting that is not controlled by your nausea medication, call the clinic.   BELOW ARE SYMPTOMS THAT SHOULD BE REPORTED IMMEDIATELY:  *FEVER GREATER THAN 100.5 F  *CHILLS WITH OR WITHOUT FEVER  NAUSEA AND VOMITING THAT IS NOT CONTROLLED WITH YOUR NAUSEA MEDICATION  *UNUSUAL SHORTNESS OF BREATH  *UNUSUAL BRUISING OR BLEEDING  TENDERNESS IN MOUTH AND THROAT WITH OR WITHOUT PRESENCE OF ULCERS  *URINARY PROBLEMS  *BOWEL PROBLEMS  UNUSUAL RASH Items with * indicate a potential emergency and should be followed up as soon as possible.  Feel free to call the clinic you have any questions or concerns. The clinic phone number is [phone].  Please show the [facility] at check-in to the Emergency Department and triage nurse.

## 2015-05-13 NOTE — Assessment & Plan Note (Signed)

## 2015-05-13 NOTE — Assessment & Plan Note (Signed)
Overall, she tolerated treatment well. We will continue cycle 6 without dose adjustment. Recent PET/CT scan showed positive response to therapy We will repeat another imaging study after cycle 6 of treatment.

## 2015-05-13 NOTE — Progress Notes (Signed)
@   [id number] - blood return noted from [facility]. PAC flushed with N/S and treatment initiated without further incident.

## 2015-05-15 ENCOUNTER — Ambulatory Visit (HOSPITAL_BASED_OUTPATIENT_CLINIC_OR_DEPARTMENT_OTHER): Payer: Medicare Other

## 2015-05-15 VITALS — BP 126/65 | HR 87 | Temp 98.2°F | Resp 18

## 2015-05-15 DIAGNOSIS — C8338 Diffuse large B-cell lymphoma, lymph nodes of multiple sites: Secondary | ICD-10-CM | POA: Diagnosis not present

## 2015-05-15 DIAGNOSIS — Z5189 Encounter for other specified aftercare: Secondary | ICD-10-CM | POA: Diagnosis not present

## 2015-05-15 MED ORDER — PEGFILGRASTIM INJECTION 6 MG/0.6ML ~~LOC~~
6.0000 mg | PREFILLED_SYRINGE | Freq: Once | SUBCUTANEOUS | Status: AC
Start: 2015-05-15 — End: 2015-05-15
  Administered 2015-05-15: 6 mg via SUBCUTANEOUS
  Filled 2015-05-15: qty 0.6

## 2015-05-15 NOTE — Patient Instructions (Signed)
Pegfilgrastim injection What is this medicine? PEGFILGRASTIM (PEG fil gra stim) is a long-acting granulocyte colony-stimulating factor that stimulates the growth of neutrophils, a type of white blood cell important in the body's fight against infection. It is used to reduce the incidence of fever and infection in patients with certain types of cancer who are receiving chemotherapy that affects the bone marrow, and to increase survival after being exposed to high doses of radiation. This medicine may be used for other purposes; ask your health care provider or pharmacist if you have questions. What should I tell my health care provider before I take this medicine? They need to know if you have any of these conditions: -kidney disease -latex allergy -ongoing radiation therapy -sickle cell disease -skin reactions to acrylic adhesives (On-Body Injector only) -an unusual or allergic reaction to pegfilgrastim, filgrastim, other medicines, foods, dyes, or preservatives -pregnant or trying to get pregnant -breast-feeding How should I use this medicine? This medicine is for injection under the skin. If you get this medicine at home, you will be taught how to prepare and give the pre-filled syringe or how to use the On-body Injector. Refer to the patient Instructions for Use for detailed instructions. Use exactly as directed. Take your medicine at regular intervals. Do not take your medicine more often than directed. It is important that you put your used needles and syringes in a special sharps container. Do not put them in a trash can. If you do not have a sharps container, call your pharmacist or healthcare provider to get one. Talk to your pediatrician regarding the use of this medicine in children. While this drug may be prescribed for selected conditions, precautions do apply. Overdosage: If you think you have taken too much of this medicine contact a poison control center or emergency room at  once. NOTE: This medicine is only for you. Do not share this medicine with others. What if I miss a dose? It is important not to miss your dose. Call your doctor or health care professional if you miss your dose. If you miss a dose due to an On-body Injector failure or leakage, a new dose should be administered as soon as possible using a single prefilled syringe for manual use. What may interact with this medicine? Interactions have not been studied. Give your health care provider a list of all the medicines, herbs, non-prescription drugs, or dietary supplements you use. Also tell them if you smoke, drink alcohol, or use illegal drugs. Some items may interact with your medicine. This list may not describe all possible interactions. Give your health care provider a list of all the medicines, herbs, non-prescription drugs, or dietary supplements you use. Also tell them if you smoke, drink alcohol, or use illegal drugs. Some items may interact with your medicine. What should I watch for while using this medicine? You may need blood work done while you are taking this medicine. If you are going to need a MRI, CT scan, or other procedure, tell your doctor that you are using this medicine (On-Body Injector only). What side effects may I notice from receiving this medicine? Side effects that you should report to your doctor or health care professional as soon as possible: -allergic reactions like skin rash, itching or hives, swelling of the face, lips, or tongue -dizziness -fever -pain, redness, or irritation at site where injected -pinpoint red spots on the skin -red or dark-brown urine -shortness of breath or breathing problems -stomach or side pain, or pain   at the shoulder -swelling -tiredness -trouble passing urine or change in the amount of urine Side effects that usually do not require medical attention (report to your doctor or health care professional if they continue or are  bothersome): -bone pain -muscle pain This list may not describe all possible side effects. Call your doctor for medical advice about side effects. You may report side effects to FDA at 1-800-FDA-1088. Where should I keep my medicine? Keep out of the reach of children. Store pre-filled syringes in a refrigerator between 2 and 8 degrees C (36 and 46 degrees F). Do not freeze. Keep in carton to protect from light. Throw away this medicine if it is left out of the refrigerator for more than 48 hours. Throw away any unused medicine after the expiration date. NOTE: This sheet is a summary. It may not cover all possible information. If you have questions about this medicine, talk to your doctor, pharmacist, or health care provider.    2016, Elsevier/Gold Standard. (2014-01-16 14:30:14)  

## 2015-06-24 ENCOUNTER — Other Ambulatory Visit: Payer: Self-pay | Admitting: Hematology and Oncology

## 2015-06-30 ENCOUNTER — Other Ambulatory Visit: Payer: Self-pay

## 2015-06-30 ENCOUNTER — Ambulatory Visit (HOSPITAL_BASED_OUTPATIENT_CLINIC_OR_DEPARTMENT_OTHER): Payer: Medicare Other

## 2015-06-30 ENCOUNTER — Ambulatory Visit: Payer: Medicare Other

## 2015-06-30 ENCOUNTER — Ambulatory Visit (HOSPITAL_COMMUNITY)
Admission: RE | Admit: 2015-06-30 | Discharge: 2015-06-30 | Disposition: A | Discharge status: Home / Self Care | Payer: Medicare Other | Source: Ambulatory Visit | Attending: Hematology and Oncology | Admitting: Hematology and Oncology

## 2015-06-30 VITALS — BP 112/69 | HR 87 | Temp 98.1°F | Resp 18

## 2015-06-30 DIAGNOSIS — I2510 Atherosclerotic heart disease of native coronary artery without angina pectoris: Secondary | ICD-10-CM | POA: Diagnosis not present

## 2015-06-30 DIAGNOSIS — C8338 Diffuse large B-cell lymphoma, lymph nodes of multiple sites: Secondary | ICD-10-CM

## 2015-06-30 DIAGNOSIS — K449 Diaphragmatic hernia without obstruction or gangrene: Secondary | ICD-10-CM | POA: Insufficient documentation

## 2015-06-30 DIAGNOSIS — D259 Leiomyoma of uterus, unspecified: Secondary | ICD-10-CM | POA: Insufficient documentation

## 2015-06-30 DIAGNOSIS — R911 Solitary pulmonary nodule: Secondary | ICD-10-CM | POA: Diagnosis not present

## 2015-06-30 DIAGNOSIS — R590 Localized enlarged lymph nodes: Secondary | ICD-10-CM | POA: Insufficient documentation

## 2015-06-30 DIAGNOSIS — K5730 Diverticulosis of large intestine without perforation or abscess without bleeding: Secondary | ICD-10-CM | POA: Insufficient documentation

## 2015-06-30 DIAGNOSIS — C8330 Diffuse large B-cell lymphoma, unspecified site: Secondary | ICD-10-CM | POA: Diagnosis not present

## 2015-06-30 LAB — COMPREHENSIVE METABOLIC PANEL
ALT: 29 U/L (ref 0–55)
AST: 27 U/L (ref 5–34)
Albumin: 3.7 g/dL (ref 3.5–5.0)
Alkaline Phosphatase: 76 U/L (ref 40–150)
Anion Gap: 9 mEq/L (ref 3–11)
BILIRUBIN TOTAL: 0.58 mg/dL (ref 0.20–1.20)
BUN: 13.9 mg/dL (ref 7.0–26.0)
CHLORIDE: 108 meq/L (ref 98–109)
CO2: 24 meq/L (ref 22–29)
CREATININE: 0.7 mg/dL (ref 0.6–1.1)
Calcium: 9.2 mg/dL (ref 8.4–10.4)
EGFR: 84 mL/min/{1.73_m2} — ABNORMAL LOW (ref >90)
GLUCOSE: 87 mg/dL (ref 70–140)
Potassium: 3.8 mEq/L (ref 3.5–5.1)
SODIUM: 141 meq/L (ref 136–145)
TOTAL PROTEIN: 6.2 g/dL — AB (ref 6.4–8.3)

## 2015-06-30 LAB — CBC WITH DIFFERENTIAL/PLATELET
BASO%: 2.8 % — AB (ref 0.0–2.0)
Basophils Absolute: 0.1 10*3/uL (ref 0.0–0.1)
EOS%: 11.5 % — AB (ref 0.0–7.0)
Eosinophils Absolute: 0.3 10*3/uL (ref 0.0–0.5)
HCT: 35.7 % (ref 34.8–46.6)
HGB: 11.7 g/dL (ref 11.6–15.9)
LYMPH%: 17.7 % (ref 14.0–49.7)
MCH: 30.3 pg (ref 25.1–34.0)
MCHC: 32.8 g/dL (ref 31.5–36.0)
MCV: 92.1 fL (ref 79.5–101.0)
MONO#: 0.5 10*3/uL (ref 0.1–0.9)
MONO%: 18.4 % — AB (ref 0.0–14.0)
NEUT%: 49.6 % (ref 38.4–76.8)
NEUTROS ABS: 1.4 10*3/uL — AB (ref 1.5–6.5)
Platelets: 193 10*3/uL (ref 145–400)
RBC: 3.88 10*6/uL (ref 3.70–5.45)
RDW: 13 % (ref 11.2–14.5)
WBC: 2.9 10*3/uL — AB (ref 3.9–10.3)
lymph#: 0.5 10*3/uL — ABNORMAL LOW (ref 0.9–3.3)

## 2015-06-30 LAB — GLUCOSE, CAPILLARY: Glucose-Capillary: 85 mg/dL (ref 65–99)

## 2015-06-30 MED ORDER — SODIUM CHLORIDE 0.9 % IJ SOLN
10.0000 mL | INTRAMUSCULAR | Status: DC | PRN
  Administered 2015-06-30: 10 mL via INTRAVENOUS
  Filled 2015-06-30: qty 10

## 2015-06-30 MED ORDER — FLUDEOXYGLUCOSE F - 18 (FDG) INJECTION
6.5100 | Freq: Once | INTRAVENOUS | Status: AC | PRN
  Administered 2015-06-30: 6.51 via INTRAVENOUS

## 2015-06-30 NOTE — Patient Instructions (Signed)

## 2015-06-30 NOTE — Progress Notes (Signed)
Pt left accessed for CT.  Advised to return to flush room to be flushed with heparin and deaccessed if CT doe not flush and deaccess her.  Pt voiced understanding.

## 2015-07-01 ENCOUNTER — Other Ambulatory Visit: Payer: Self-pay

## 2015-07-01 ENCOUNTER — Ambulatory Visit (HOSPITAL_BASED_OUTPATIENT_CLINIC_OR_DEPARTMENT_OTHER): Payer: Medicare Other | Admitting: Hematology and Oncology

## 2015-07-01 ENCOUNTER — Telehealth: Payer: Self-pay | Admitting: Hematology and Oncology

## 2015-07-01 ENCOUNTER — Encounter: Payer: Self-pay | Admitting: Hematology and Oncology

## 2015-07-01 ENCOUNTER — Ambulatory Visit: Payer: Self-pay

## 2015-07-01 VITALS — BP 120/78 | HR 96 | Temp 98.1°F | Resp 18 | Ht 65.0 in | Wt 126.9 lb

## 2015-07-01 DIAGNOSIS — D701 Agranulocytosis secondary to cancer chemotherapy: Secondary | ICD-10-CM | POA: Insufficient documentation

## 2015-07-01 DIAGNOSIS — R432 Parageusia: Secondary | ICD-10-CM

## 2015-07-01 DIAGNOSIS — T451X5A Adverse effect of antineoplastic and immunosuppressive drugs, initial encounter: Secondary | ICD-10-CM

## 2015-07-01 DIAGNOSIS — C8338 Diffuse large B-cell lymphoma, lymph nodes of multiple sites: Secondary | ICD-10-CM

## 2015-07-01 NOTE — Telephone Encounter (Signed)
Gave and printed appt sched and avs fo rpt for Aug and Sept °

## 2015-07-01 NOTE — Assessment & Plan Note (Signed)
This is a common side effects of treatment. It should improve in the near future.

## 2015-07-01 NOTE — Assessment & Plan Note (Signed)
This is likely due to recent treatment. The patient denies recent history of fevers, cough, chills, diarrhea or dysuria. She is asymptomatic from the leukopenia. I will observe for now.    

## 2015-07-01 NOTE — Assessment & Plan Note (Signed)
PET/CT scan showed near-complete remission. She has persistent mild right hilar lymphadenopathy and lung nodule in the lung. I recommend CT scan in 3 months along with blood work. If CT is unremarkable, I will get the port removed

## 2015-07-01 NOTE — Progress Notes (Signed)
[place] OFFICE PROGRESS NOTE  Patient Care Team: Signe Colt, MD as PCP - General (Obstetrics and Gynecology) Coralie Keens, MD as Consulting Physician (General Surgery)  SUMMARY OF ONCOLOGIC HISTORY:   Diffuse large b-cell lymphoma, lymph nodes of multiple sites [facility])   01/07/2015 Procedure She had excisional biopsy of left axilla   01/07/2015 Pathology Results Accession: [id number] showed follicular and DLBCL   03/18/9371 Imaging PET scan showed stage III disease   01/20/2015 Bone Marrow Biopsy BM biopsy showed normal cytogenetics with possible involvement of low grade lymphoma.  Accession: [id number]   01/22/2015 Imaging ECHo showed normal EF   01/27/2015 - 05/13/2015 Chemotherapy She received R-CHOP chemo x 6   02/12/2015 Procedure She had port placement   03/30/2015 Imaging PET CT scan showed positive response to treatment   06/30/2015 PET scan Mildly hypermetabolic right lower paratracheal lymph node, which has slightly decreased in size and metabolism.    INTERVAL HISTORY: Please see below for problem oriented charting. She returns to review test results. She complained of mild fatigue but was able to exercise in the gym again. She continues to have altered taste sensation. No recent infection.  REVIEW OF SYSTEMS:   Constitutional: Denies fevers, chills or abnormal weight loss Eyes: Denies blurriness of vision Ears, nose, mouth, throat, and face: Denies mucositis or sore throat Respiratory: Denies cough, dyspnea or wheezes Cardiovascular: Denies palpitation, chest discomfort or lower extremity swelling Gastrointestinal:  Denies nausea, heartburn or change in bowel habits Skin: Denies abnormal skin rashes Lymphatics: Denies new lymphadenopathy or easy bruising Neurological:Denies numbness, tingling or new weaknesses Behavioral/Psych: Mood is stable, no new changes  All other systems were reviewed with the patient and are negative.  I have reviewed the past  medical history, past surgical history, social history and family history with the patient and they are unchanged from previous note.  ALLERGIES:  has No Known Allergies.  MEDICATIONS:  Current Outpatient Prescriptions  Medication Sig Dispense Refill  . calcium carbonate (OSCAL) 1500 (600 CA) MG TABS tablet Take by mouth 2 (two) times daily with a meal.    . cetirizine (ZYRTEC) 10 MG tablet Take 10 mg by mouth daily.    Marland Kitchen lidocaine-prilocaine (EMLA) cream Apply to affected area once 30 g 3  . ondansetron (ZOFRAN) 8 MG tablet Take 1 tablet (8 mg total) by mouth every 8 (eight) hours as needed. 30 tablet 1  . predniSONE (DELTASONE) 20 MG tablet Take 3 tablets (60 mg total) by mouth daily. Take on days 2-5 of chemotherapy every 3 weeks 9 tablet 0  . prochlorperazine (COMPAZINE) 10 MG tablet Take 1 tablet (10 mg total) by mouth every 6 (six) hours as needed (Nausea or vomiting). 30 tablet 6   No current facility-administered medications for this visit.    PHYSICAL EXAMINATION: ECOG PERFORMANCE STATUS: 0 - Asymptomatic  Filed Vitals:   07/01/15 0923  BP: 120/78  Pulse: 96  Temp: 98.1 F (36.7 C)  Resp: 18   Filed Weights   07/01/15 0923  Weight: 126 lb 14.4 oz (57.561 kg)    GENERAL:alert, no distress and comfortable SKIN: skin color, texture, turgor are normal, no rashes or significant lesions EYES: normal, Conjunctiva are pink and non-injected, sclera clear Musculoskeletal:no cyanosis of digits and no clubbing  NEURO: alert & oriented x 3 with fluent speech, no focal motor/sensory deficits  LABORATORY DATA:  I have reviewed the data as listed    Component Value Date/Time   NA 141 06/30/2015 1572  K 3.8 06/30/2015 0821   CO2 24 06/30/2015 0821   GLUCOSE 87 06/30/2015 0821   BUN 13.9 06/30/2015 0821   CREATININE 0.7 06/30/2015 0821   CALCIUM 9.2 06/30/2015 0821   PROT 6.2* 06/30/2015 0821   ALBUMIN 3.7 06/30/2015 0821   AST 27 06/30/2015 0821   ALT 29 06/30/2015 0821    ALKPHOS 76 06/30/2015 0821   BILITOT 0.58 06/30/2015 0821    No results found for: SPEP, UPEP  Lab Results  Component Value Date   WBC 2.9* 06/30/2015   NEUTROABS 1.4* 06/30/2015   HGB 11.7 06/30/2015   HCT 35.7 06/30/2015   MCV 92.1 06/30/2015   PLT 193 06/30/2015      Chemistry      Component Value Date/Time   NA 141 06/30/2015 0821   K 3.8 06/30/2015 0821   CO2 24 06/30/2015 0821   BUN 13.9 06/30/2015 0821   CREATININE 0.7 06/30/2015 0821      Component Value Date/Time   CALCIUM 9.2 06/30/2015 0821   ALKPHOS 76 06/30/2015 0821   AST 27 06/30/2015 0821   ALT 29 06/30/2015 0821   BILITOT 0.58 06/30/2015 0821       RADIOGRAPHIC STUDIES: I have personally reviewed the radiological images as listed and agreed with the findings in the report. Nm Pet Image Restag (ps) Skull Base To Thigh  06/30/2015  CLINICAL DATA:  Subsequent treatment strategy for diffuse large B-cell lymphoma. Restaging after therapy. EXAM: NUCLEAR MEDICINE PET SKULL BASE TO THIGH TECHNIQUE: 6.5 mCi F-18 FDG was injected intravenously. Full-ring PET imaging was performed from the skull base to thigh after the radiotracer. CT data was obtained and used for attenuation correction and anatomic localization. FASTING BLOOD GLUCOSE:  Value: 85 mg/dl COMPARISON:  03/30/2015 PET-CT. FINDINGS: NECK No hypermetabolic lymph nodes in the neck. CHEST Mildly hypermetabolic 0.9 cm right lower paratracheal node with max SUV 4.2 (series 4/image 60), previously 1.0 cm with max SUV 4.7, slightly decreased in size and metabolism. No new or additional residual hypermetabolic mediastinal or hilar adenopathy. No hypermetabolic axillary nodes. Left subclavian MediPort terminates in the middle third of the superior vena cava. Left anterior descending, left circumflex and right coronary atherosclerosis. Mildly atherosclerotic nonaneurysmal thoracic aorta. No pleural effusions. Anterior right upper lobe 4 mm ground-glass pulmonary  nodule (series 6/image 40), stable since 01/19/2015. No acute consolidative airspace disease or new significant pulmonary nodules. ABDOMEN/PELVIS No abnormal hypermetabolic activity within the liver, pancreas, adrenal glands, or spleen. No hypermetabolic lymph nodes in the abdomen or pelvis. Small hiatal hernia. Mild hypermetabolism on the gastric cardia without CT correlate (max SUV 4.5), mildly decreased (previous max SUV 5.0), favor physiologic activity. Stable small calcified uterine fibroid. Minimal sigmoid diverticulosis. SKELETON No focal hypermetabolic activity to suggest skeletal metastasis. IMPRESSION: 1. Mildly hypermetabolic right lower paratracheal lymph node, which has slightly decreased in size and metabolism. 2. Otherwise no new or residual sites of metabolically active lymphoma in the neck, chest, abdomen or pelvis. 3. Right upper lobe 4 mm ground-glass pulmonary nodule, for which 5 month stability has been demonstrated, probably benign, below PET resolution. 4. Additional findings include three-vessel coronary atherosclerosis, small hiatal hernia, calcified uterine fibroid and minimal sigmoid diverticulosis. Electronically Signed   By: Ilona Sorrel M.D.   On: 06/30/2015 10:41     ASSESSMENT & PLAN:  Diffuse large b-cell lymphoma, lymph nodes of multiple sites [facility]) PET/CT scan showed near-complete remission. She has persistent mild right hilar lymphadenopathy and lung nodule in the lung. I recommend  CT scan in 3 months along with blood work. If CT is unremarkable, I will get the port removed  Dysgeusia This is a common side effects of treatment. It should improve in the near future.    Leukopenia due to antineoplastic chemotherapy This is likely due to recent treatment. The patient denies recent history of fevers, cough, chills, diarrhea or dysuria. She is asymptomatic from the leukopenia. I will observe for now.     Orders Placed This Encounter  Procedures  . CT CHEST W  CONTRAST    Standing Status: Future     Number of Occurrences:      Standing Expiration Date: 08/30/2016    Order Specific Question:  Reason for Exam (SYMPTOM  OR DIAGNOSIS REQUIRED)    Answer:  lymphoma, lung nodules, exclude recurrence    Order Specific Question:  Preferred imaging location?    Answer:  [facility]   All questions were answered. The patient knows to call the clinic with any problems, questions or concerns. No barriers to learning was detected. I spent 15 minutes counseling the patient face to face. The total time spent in the appointment was 20 minutes and more than 50% was on counseling and review of test results     [facility] Henritta Mutz, MD 07/01/2015 11:08 AM

## 2015-07-06 ENCOUNTER — Other Ambulatory Visit: Payer: Self-pay

## 2015-08-11 ENCOUNTER — Ambulatory Visit (HOSPITAL_BASED_OUTPATIENT_CLINIC_OR_DEPARTMENT_OTHER): Payer: Medicare Other

## 2015-08-11 VITALS — BP 130/72 | HR 82 | Temp 97.4°F | Resp 18

## 2015-08-11 DIAGNOSIS — C8338 Diffuse large B-cell lymphoma, lymph nodes of multiple sites: Secondary | ICD-10-CM | POA: Diagnosis not present

## 2015-08-11 DIAGNOSIS — Z452 Encounter for adjustment and management of vascular access device: Secondary | ICD-10-CM

## 2015-08-11 MED ORDER — SODIUM CHLORIDE 0.9 % IJ SOLN
10.0000 mL | INTRAMUSCULAR | Status: DC | PRN
  Administered 2015-08-11: 10 mL via INTRAVENOUS
  Filled 2015-08-11: qty 10

## 2015-08-11 MED ORDER — HEPARIN SOD (PORK) LOCK FLUSH 100 UNIT/ML IV SOLN
500.0000 [IU] | Freq: Once | INTRAVENOUS | Status: AC | PRN
  Administered 2015-08-11: 500 [IU] via INTRAVENOUS
  Filled 2015-08-11: qty 5

## 2015-09-28 ENCOUNTER — Encounter (HOSPITAL_COMMUNITY): Payer: Self-pay

## 2015-09-28 ENCOUNTER — Ambulatory Visit (HOSPITAL_COMMUNITY)
Admission: RE | Admit: 2015-09-28 | Discharge: 2015-09-28 | Disposition: A | Discharge status: Home / Self Care | Payer: Medicare Other | Source: Ambulatory Visit | Attending: Hematology and Oncology | Admitting: Hematology and Oncology

## 2015-09-28 ENCOUNTER — Ambulatory Visit: Payer: Medicare Other

## 2015-09-28 ENCOUNTER — Other Ambulatory Visit (HOSPITAL_BASED_OUTPATIENT_CLINIC_OR_DEPARTMENT_OTHER): Payer: Medicare Other

## 2015-09-28 DIAGNOSIS — C8590 Non-Hodgkin lymphoma, unspecified, unspecified site: Secondary | ICD-10-CM | POA: Diagnosis not present

## 2015-09-28 DIAGNOSIS — I700 Atherosclerosis of aorta: Secondary | ICD-10-CM | POA: Diagnosis not present

## 2015-09-28 DIAGNOSIS — C8338 Diffuse large B-cell lymphoma, lymph nodes of multiple sites: Secondary | ICD-10-CM | POA: Diagnosis not present

## 2015-09-28 DIAGNOSIS — I2510 Atherosclerotic heart disease of native coronary artery without angina pectoris: Secondary | ICD-10-CM | POA: Insufficient documentation

## 2015-09-28 LAB — COMPREHENSIVE METABOLIC PANEL
ALBUMIN: 3.7 g/dL (ref 3.5–5.0)
ALK PHOS: 89 U/L (ref 40–150)
ALT: 26 U/L (ref 0–55)
AST: 23 U/L (ref 5–34)
Anion Gap: 9 mEq/L (ref 3–11)
BUN: 16.7 mg/dL (ref 7.0–26.0)
CHLORIDE: 108 meq/L (ref 98–109)
CO2: 26 mEq/L (ref 22–29)
CREATININE: 0.7 mg/dL (ref 0.6–1.1)
Calcium: 9.5 mg/dL (ref 8.4–10.4)
EGFR: 86 mL/min/{1.73_m2} — ABNORMAL LOW (ref >90)
GLUCOSE: 95 mg/dL (ref 70–140)
POTASSIUM: 4.2 meq/L (ref 3.5–5.1)
SODIUM: 143 meq/L (ref 136–145)
Total Bilirubin: 0.58 mg/dL (ref 0.20–1.20)
Total Protein: 6.4 g/dL (ref 6.4–8.3)

## 2015-09-28 LAB — CBC WITH DIFFERENTIAL/PLATELET
BASO%: 1.1 % (ref 0.0–2.0)
BASOS ABS: 0 10*3/uL (ref 0.0–0.1)
EOS%: 2.6 % (ref 0.0–7.0)
Eosinophils Absolute: 0.1 10*3/uL (ref 0.0–0.5)
HEMATOCRIT: 38.9 % (ref 34.8–46.6)
HEMOGLOBIN: 12.8 g/dL (ref 11.6–15.9)
LYMPH#: 0.8 10*3/uL — AB (ref 0.9–3.3)
LYMPH%: 21.4 % (ref 14.0–49.7)
MCH: 28.7 pg (ref 25.1–34.0)
MCHC: 32.9 g/dL (ref 31.5–36.0)
MCV: 87.3 fL (ref 79.5–101.0)
MONO#: 0.5 10*3/uL (ref 0.1–0.9)
MONO%: 13.3 % (ref 0.0–14.0)
NEUT#: 2.2 10*3/uL (ref 1.5–6.5)
NEUT%: 61.6 % (ref 38.4–76.8)
Platelets: 210 10*3/uL (ref 145–400)
RBC: 4.46 10*6/uL (ref 3.70–5.45)
RDW: 14.2 % (ref 11.2–14.5)
WBC: 3.6 10*3/uL — AB (ref 3.9–10.3)

## 2015-09-28 MED ORDER — IOPAMIDOL (ISOVUE-300) INJECTION 61%
75.0000 mL | Freq: Once | INTRAVENOUS | Status: AC | PRN
  Administered 2015-09-28: 75 mL via INTRAVENOUS

## 2015-09-28 MED ORDER — HEPARIN SOD (PORK) LOCK FLUSH 100 UNIT/ML IV SOLN
500.0000 [IU] | Freq: Once | INTRAVENOUS | Status: AC | PRN
  Administered 2015-09-28: 500 [IU] via INTRAVENOUS
  Filled 2015-09-28: qty 5

## 2015-09-28 MED ORDER — SODIUM CHLORIDE 0.9 % IJ SOLN
10.0000 mL | INTRAMUSCULAR | Status: DC | PRN
  Administered 2015-09-28: 10 mL via INTRAVENOUS
  Filled 2015-09-28: qty 10

## 2015-09-28 MED ORDER — IOPAMIDOL (ISOVUE-300) INJECTION 61%: 100.0000 mL | Freq: Once | INTRAVENOUS | Status: DC | PRN

## 2015-09-29 ENCOUNTER — Encounter: Payer: Self-pay | Admitting: Hematology and Oncology

## 2015-09-29 ENCOUNTER — Telehealth: Payer: Self-pay | Admitting: Hematology and Oncology

## 2015-09-29 ENCOUNTER — Ambulatory Visit (HOSPITAL_BASED_OUTPATIENT_CLINIC_OR_DEPARTMENT_OTHER): Payer: Medicare Other | Admitting: Hematology and Oncology

## 2015-09-29 VITALS — BP 128/69 | HR 80 | Temp 98.4°F | Resp 18 | Ht 65.0 in | Wt 125.8 lb

## 2015-09-29 DIAGNOSIS — R5383 Other fatigue: Secondary | ICD-10-CM | POA: Diagnosis not present

## 2015-09-29 DIAGNOSIS — D701 Agranulocytosis secondary to cancer chemotherapy: Secondary | ICD-10-CM

## 2015-09-29 DIAGNOSIS — D72819 Decreased white blood cell count, unspecified: Secondary | ICD-10-CM | POA: Diagnosis not present

## 2015-09-29 DIAGNOSIS — Z299 Encounter for prophylactic measures, unspecified: Secondary | ICD-10-CM

## 2015-09-29 DIAGNOSIS — C8338 Diffuse large B-cell lymphoma, lymph nodes of multiple sites: Secondary | ICD-10-CM | POA: Diagnosis not present

## 2015-09-29 DIAGNOSIS — T451X5A Adverse effect of antineoplastic and immunosuppressive drugs, initial encounter: Secondary | ICD-10-CM

## 2015-09-29 DIAGNOSIS — R432 Parageusia: Secondary | ICD-10-CM | POA: Diagnosis not present

## 2015-09-29 DIAGNOSIS — Z23 Encounter for immunization: Secondary | ICD-10-CM | POA: Diagnosis not present

## 2015-09-29 MED ORDER — INFLUENZA VAC SPLIT QUAD 0.5 ML IM SUSY
0.5000 mL | PREFILLED_SYRINGE | Freq: Once | INTRAMUSCULAR | Status: AC
  Administered 2015-09-29: 0.5 mL via INTRAMUSCULAR
  Filled 2015-09-29: qty 0.5

## 2015-09-29 NOTE — Assessment & Plan Note (Signed)
This is a common side effects of treatment. It should improve in the near future.

## 2015-09-29 NOTE — Progress Notes (Signed)
[facility] OFFICE PROGRESS NOTE  Patient Care Team: Signe Colt, MD as PCP - General (Obstetrics and Gynecology) Coralie Keens, MD as Consulting Physician (General Surgery)  SUMMARY OF ONCOLOGIC HISTORY:   Diffuse large b-cell lymphoma, lymph nodes of multiple sites [facility])   01/07/2015 Procedure    She had excisional biopsy of left axilla      01/07/2015 Pathology Results    Accession: [id number] showed follicular and DLBCL      01/19/2015 Imaging    PET scan showed stage III disease      01/20/2015 Bone Marrow Biopsy    BM biopsy showed normal cytogenetics with possible involvement of low grade lymphoma.  Accession: [id number]      01/22/2015 Imaging    ECHo showed normal EF      01/27/2015 - 05/13/2015 Chemotherapy    She received R-CHOP chemo x 6      02/12/2015 Procedure    She had port placement      03/30/2015 Imaging    PET CT scan showed positive response to treatment      06/30/2015 PET scan    Mildly hypermetabolic right lower paratracheal lymph node, which has slightly decreased in size and metabolism.      09/28/2015 Imaging    Ct chest showed low right paratracheal lymph node is stable in size. No additional evidence of recurrent lymphoma       INTERVAL HISTORY: Please see below for problem oriented charting. She returns with family members to review imaging results. She denies recent infection. She is attempting to exercise regularly. She complained of fatigue with low stamina and persistent dysgeusia. No new lymphadenopathy. Her hair is growing back As she exercises, she have intermittent lower back pain that comes and goes  REVIEW OF SYSTEMS:   Constitutional: Denies fevers, chills or abnormal weight loss Eyes: Denies blurriness of vision Ears, nose, mouth, throat, and face: Denies mucositis or sore throat Respiratory: Denies cough, dyspnea or wheezes Cardiovascular: Denies palpitation, chest discomfort or lower extremity  swelling Gastrointestinal:  Denies nausea, heartburn or change in bowel habits Skin: Denies abnormal skin rashes Lymphatics: Denies new lymphadenopathy or easy bruising Neurological:Denies numbness, tingling or new weaknesses Behavioral/Psych: Mood is stable, no new changes  All other systems were reviewed with the patient and are negative.  I have reviewed the past medical history, past surgical history, social history and family history with the patient and they are unchanged from previous note.  ALLERGIES:  has No Known Allergies.  MEDICATIONS:  Current Outpatient Prescriptions  Medication Sig Dispense Refill  . calcium carbonate (OSCAL) 1500 (600 CA) MG TABS tablet Take by mouth 2 (two) times daily with a meal.    . cetirizine (ZYRTEC) 10 MG tablet Take 10 mg by mouth daily.    Marland Kitchen lidocaine-prilocaine (EMLA) cream Apply to affected area once (Patient not taking: Reported on 09/29/2015) 30 g 3  . ondansetron (ZOFRAN) 8 MG tablet Take 1 tablet (8 mg total) by mouth every 8 (eight) hours as needed. (Patient not taking: Reported on 09/29/2015) 30 tablet 1  . prochlorperazine (COMPAZINE) 10 MG tablet Take 1 tablet (10 mg total) by mouth every 6 (six) hours as needed (Nausea or vomiting). (Patient not taking: Reported on 09/29/2015) 30 tablet 6   No current facility-administered medications for this visit.     PHYSICAL EXAMINATION: ECOG PERFORMANCE STATUS: 1 - Symptomatic but completely ambulatory  Vitals:   09/29/15 0919  BP: 128/69  Pulse: 80  Resp: 18  Temp: 98.4 F (36.9 C)   Filed Weights   09/29/15 0919  Weight: 125 lb 12.8 oz (57.1 kg)    GENERAL:alert, no distress and comfortable SKIN: skin color, texture, turgor are normal, no rashes or significant lesions EYES: normal, Conjunctiva are pink and non-injected, sclera clear Musculoskeletal:no cyanosis of digits and no clubbing  NEURO: alert & oriented x 3 with fluent speech, no focal motor/sensory deficits  LABORATORY  DATA:  I have reviewed the data as listed    Component Value Date/Time   NA 143 09/28/2015 0854   K 4.2 09/28/2015 0854   CO2 26 09/28/2015 0854   GLUCOSE 95 09/28/2015 0854   BUN 16.7 09/28/2015 0854   CREATININE 0.7 09/28/2015 0854   CALCIUM 9.5 09/28/2015 0854   PROT 6.4 09/28/2015 0854   ALBUMIN 3.7 09/28/2015 0854   AST 23 09/28/2015 0854   ALT 26 09/28/2015 0854   ALKPHOS 89 09/28/2015 0854   BILITOT 0.58 09/28/2015 0854    No results found for: SPEP, UPEP  Lab Results  Component Value Date   WBC 3.6 (L) 09/28/2015   NEUTROABS 2.2 09/28/2015   HGB 12.8 09/28/2015   HCT 38.9 09/28/2015   MCV 87.3 09/28/2015   PLT 210 09/28/2015      Chemistry      Component Value Date/Time   NA 143 09/28/2015 0854   K 4.2 09/28/2015 0854   CO2 26 09/28/2015 0854   BUN 16.7 09/28/2015 0854   CREATININE 0.7 09/28/2015 0854      Component Value Date/Time   CALCIUM 9.5 09/28/2015 0854   ALKPHOS 89 09/28/2015 0854   AST 23 09/28/2015 0854   ALT 26 09/28/2015 0854   BILITOT 0.58 09/28/2015 0854       RADIOGRAPHIC STUDIES: I have personally reviewed the radiological images as listed and agreed with the findings in the report. Ct Chest W Contrast  Result Date: 09/28/2015 CLINICAL DATA:  Lymphoma, low back pain. EXAM: CT CHEST WITH CONTRAST TECHNIQUE: Multidetector CT imaging of the chest was performed during intravenous contrast administration. CONTRAST:  52m ISOVUE-300 IOPAMIDOL (ISOVUE-300) INJECTION 61% COMPARISON:  PET 06/30/2015. FINDINGS: Cardiovascular: Atherosclerotic calcification of the arterial vasculature, including coronary arteries. Heart size normal. No pericardial effusion. Left IJ Port-A-Cath terminates in the SVC. Mediastinum/Nodes: Mediastinal lymph nodes measure up to 11 mm in the low right paratracheal station, similar. No hilar or axillary adenopathy. Esophagus is grossly unremarkable. Tiny hiatal hernia. Lungs/Pleura: Biapical pleural parenchymal scarring. No  worrisome lytic or sclerotic lesions. Airway is unremarkable. Upper Abdomen: Visualized portions of the liver, adrenal glands, kidneys, spleen, pancreas, stomach and bowel are unremarkable. No upper abdominal adenopathy. Musculoskeletal: No worrisome lytic or sclerotic lesions. IMPRESSION: 1. Low right paratracheal lymph node is stable in size. No additional evidence of recurrent lymphoma. 2. Aortic atherosclerosis and coronary artery calcification. Electronically Signed   By: MLorin PicketM.D.   On: 09/28/2015 15:22     ASSESSMENT & PLAN:  Diffuse large b-cell lymphoma, lymph nodes of multiple sites ([facility] PET/CT scan complete remission. She has persistent mild right hilar lymphadenopathy and lung nodule in the lung, likely benign I do not recommend routine surveillance CT scan for another 1 year. I will get the port removed I will see her back in 6 months for repeat blood work, history and physical examination.  Leukopenia due to antineoplastic chemotherapy This is likely due to recent treatment. The patient denies recent history of fevers, cough, chills, diarrhea or dysuria. She is asymptomatic from the  leukopenia. I will observe for now.    Other fatigue She complained of persistent fatigue after chemotherapy which is not unexpected. I suspect her energy level would improve in the near future. I will observe for now. I recommend graduated exercise as tolerated.  Dysgeusia This is a common side effects of treatment. It should improve in the near future.  Preventive measure We discussed the importance of preventive care and reviewed the vaccination programs. She does not have any prior allergic reactions to influenza vaccination. She agrees to proceed with influenza vaccination today and we will administer it today at the clinic.    No orders of the defined types were placed in this encounter.  All questions were answered. The patient knows to call the clinic with any problems,  questions or concerns. No barriers to learning was detected. I spent 25 minutes counseling the patient face to face. The total time spent in the appointment was 30 minutes and more than 50% was on counseling and review of test results     [facility], Sukari Grist, MD 09/29/2015 1:12 PM

## 2015-09-29 NOTE — Assessment & Plan Note (Addendum)
PET/CT scan complete remission. She has persistent mild right hilar lymphadenopathy and lung nodule in the lung, likely benign I do not recommend routine surveillance CT scan for another 1 year. I will get the port removed I will see her back in 6 months for repeat blood work, history and physical examination.

## 2015-09-29 NOTE — Assessment & Plan Note (Signed)
She complained of persistent fatigue after chemotherapy which is not unexpected. I suspect her energy level would improve in the near future. I will observe for now. I recommend graduated exercise as tolerated.

## 2015-09-29 NOTE — Assessment & Plan Note (Signed)
This is likely due to recent treatment. The patient denies recent history of fevers, cough, chills, diarrhea or dysuria. She is asymptomatic from the leukopenia. I will observe for now.    

## 2015-09-29 NOTE — Assessment & Plan Note (Signed)
We discussed the importance of preventive care and reviewed the vaccination programs. She does not have any prior allergic reactions to influenza vaccination. She agrees to proceed with influenza vaccination today and we will administer it today at the clinic.  

## 2015-09-29 NOTE — Telephone Encounter (Signed)
Avs report and appointment schedule given to patient per 09/29/15 los. °

## 2015-10-01 ENCOUNTER — Other Ambulatory Visit: Payer: Self-pay | Admitting: Surgery

## 2015-10-02 ENCOUNTER — Encounter (HOSPITAL_BASED_OUTPATIENT_CLINIC_OR_DEPARTMENT_OTHER): Payer: Self-pay | Admitting: *Deleted

## 2015-10-06 NOTE — H&P (Signed)
Maria Perez is an 75 y.o. female.   Chief Complaint: port a cath in place HPI: She has a history of lymphoma and she has completed chemotherapy and wishes to have her port removed.  She is doing well and is without complaints  Past Medical History:  Diagnosis Date  . Diffuse large B cell lymphoma ([place]) 01/16/2015  . Diffuse large b-cell lymphoma, lymph nodes of multiple sites ([place]) 01/16/2015    Past Surgical History:  Procedure Laterality Date  . AXILLARY LYMPH NODE BIOPSY Left 01/07/2015   Procedure: EXCISIONAL LEFT AXILLARY LYMPH NODE BIOPSY;  Surgeon: Coralie Keens, MD;  Location: [place];  Service: General;  Laterality: Left;  . PORTACATH PLACEMENT Left 02/12/2015   Procedure: INSERTION PORT-A-CATH;  Surgeon: Coralie Keens, MD;  Location: [place];  Service: General;  Laterality: Left;  . TONSILLECTOMY      Family History  Problem Relation Age of Onset  . Emphysema Father    Social History:  reports that she has never smoked. She has never used smokeless tobacco. She reports that she does not drink alcohol or use drugs.  Allergies: No Known Allergies  No prescriptions prior to admission.    No results found for this or any previous visit (from the past 48 hour(s)). No results found.  Review of Systems  All other systems reviewed and are negative.   Height 5\' 5"  (1.651 m), weight 56.7 kg (125 lb). Physical Exam  Constitutional: She is oriented to person, place, and time. She appears well-developed and well-nourished.  HENT:  Head: Normocephalic and atraumatic.  Eyes: Pupils are equal, round, and reactive to light.  Cardiovascular: Normal rate, regular rhythm and normal heart sounds.   Respiratory: Effort normal and breath sounds normal. No respiratory distress.  GI: Soft. There is no tenderness.  Musculoskeletal: Normal range of motion.  Neurological: She is alert and oriented to person, place, and time.  Skin: Skin is warm. No  erythema.  Psychiatric: Her behavior is normal.     Assessment/Plan Port a cath in place  We will now proceed with port removal as it is no longer needed.  I discussed the risks of bleeding and infection, etc with her and she agrees to proceed.  Harl Bowie, MD 10/06/2015, 8:47 PM

## 2015-10-07 ENCOUNTER — Ambulatory Visit (HOSPITAL_BASED_OUTPATIENT_CLINIC_OR_DEPARTMENT_OTHER): Payer: Medicare Other | Admitting: Certified Registered"

## 2015-10-07 ENCOUNTER — Ambulatory Visit (HOSPITAL_BASED_OUTPATIENT_CLINIC_OR_DEPARTMENT_OTHER)
Admission: RE | Admit: 2015-10-07 | Discharge: 2015-10-07 | Disposition: A | Discharge status: Home / Self Care | Payer: Medicare Other | Source: Ambulatory Visit | Attending: Surgery | Admitting: Surgery

## 2015-10-07 ENCOUNTER — Encounter (HOSPITAL_BASED_OUTPATIENT_CLINIC_OR_DEPARTMENT_OTHER)
Admission: RE | Disposition: A | Discharge status: Home / Self Care | Payer: Self-pay | Source: Ambulatory Visit | Attending: Surgery

## 2015-10-07 ENCOUNTER — Encounter (HOSPITAL_BASED_OUTPATIENT_CLINIC_OR_DEPARTMENT_OTHER): Payer: Self-pay | Admitting: Certified Registered"

## 2015-10-07 DIAGNOSIS — Z8572 Personal history of non-Hodgkin lymphomas: Secondary | ICD-10-CM | POA: Diagnosis not present

## 2015-10-07 DIAGNOSIS — Z452 Encounter for adjustment and management of vascular access device: Secondary | ICD-10-CM | POA: Diagnosis not present

## 2015-10-07 HISTORY — PX: PORT-A-CATH REMOVAL: SHX5289

## 2015-10-07 SURGERY — REMOVAL PORT-A-CATH
Anesthesia: Monitor Anesthesia Care | Site: Chest | Laterality: Left

## 2015-10-07 MED ORDER — FENTANYL CITRATE (PF) 100 MCG/2ML IJ SOLN
INTRAMUSCULAR | Status: AC
  Filled 2015-10-07: qty 6

## 2015-10-07 MED ORDER — TRAMADOL HCL 50 MG PO TABS: 50.0000 mg | ORAL_TABLET | Freq: Four times a day (QID) | ORAL | 0 refills | Status: DC | PRN

## 2015-10-07 MED ORDER — PROPOFOL 500 MG/50ML IV EMUL
INTRAVENOUS | Status: DC | PRN
  Administered 2015-10-07: 30 ug via INTRAVENOUS
  Administered 2015-10-07: 10 ug via INTRAVENOUS

## 2015-10-07 MED ORDER — LIDOCAINE 2% (20 MG/ML) 5 ML SYRINGE
INTRAMUSCULAR | Status: AC
  Filled 2015-10-07: qty 5

## 2015-10-07 MED ORDER — FENTANYL CITRATE (PF) 100 MCG/2ML IJ SOLN: 25.0000 ug | INTRAMUSCULAR | Status: DC | PRN

## 2015-10-07 MED ORDER — LACTATED RINGERS IV SOLN
INTRAVENOUS | Status: DC
  Administered 2015-10-07: 12:00:00 via INTRAVENOUS

## 2015-10-07 MED ORDER — HYDROCODONE-ACETAMINOPHEN 7.5-325 MG PO TABS: 1.0000 | ORAL_TABLET | Freq: Once | ORAL | Status: DC | PRN

## 2015-10-07 MED ORDER — CHLORHEXIDINE GLUCONATE CLOTH 2 % EX PADS: 6.0000 | MEDICATED_PAD | Freq: Once | CUTANEOUS | Status: DC

## 2015-10-07 MED ORDER — SCOPOLAMINE 1 MG/3DAYS TD PT72: 1.0000 | MEDICATED_PATCH | Freq: Once | TRANSDERMAL | Status: DC | PRN

## 2015-10-07 MED ORDER — MIDAZOLAM HCL 2 MG/2ML IJ SOLN
INTRAMUSCULAR | Status: AC
  Filled 2015-10-07: qty 2

## 2015-10-07 MED ORDER — PROPOFOL 500 MG/50ML IV EMUL: INTRAVENOUS | Status: DC | PRN

## 2015-10-07 MED ORDER — CEFAZOLIN SODIUM-DEXTROSE 2-4 GM/100ML-% IV SOLN
INTRAVENOUS | Status: AC
  Filled 2015-10-07: qty 100

## 2015-10-07 MED ORDER — CEFAZOLIN SODIUM-DEXTROSE 2-4 GM/100ML-% IV SOLN
2.0000 g | INTRAVENOUS | Status: AC
  Administered 2015-10-07: 2 g via INTRAVENOUS

## 2015-10-07 MED ORDER — ONDANSETRON HCL 4 MG/2ML IJ SOLN
INTRAMUSCULAR | Status: AC
  Filled 2015-10-07: qty 2

## 2015-10-07 MED ORDER — GLYCOPYRROLATE 0.2 MG/ML IJ SOLN: 0.2000 mg | Freq: Once | INTRAMUSCULAR | Status: DC | PRN

## 2015-10-07 MED ORDER — MIDAZOLAM HCL 2 MG/2ML IJ SOLN
1.0000 mg | INTRAMUSCULAR | Status: DC | PRN
  Administered 2015-10-07: 1 mg via INTRAVENOUS

## 2015-10-07 MED ORDER — LIDOCAINE HCL (PF) 1 % IJ SOLN
INTRAMUSCULAR | Status: AC
  Filled 2015-10-07: qty 30

## 2015-10-07 MED ORDER — LIDOCAINE HCL (CARDIAC) 20 MG/ML IV SOLN
INTRAVENOUS | Status: DC | PRN
  Administered 2015-10-07: 20 mg via INTRAVENOUS

## 2015-10-07 MED ORDER — FENTANYL CITRATE (PF) 100 MCG/2ML IJ SOLN
INTRAMUSCULAR | Status: AC
  Filled 2015-10-07: qty 2

## 2015-10-07 MED ORDER — PROMETHAZINE HCL 25 MG/ML IJ SOLN: 6.2500 mg | INTRAMUSCULAR | Status: DC | PRN

## 2015-10-07 MED ORDER — LIDOCAINE HCL (PF) 1 % IJ SOLN
INTRAMUSCULAR | Status: DC | PRN
  Administered 2015-10-07: 4 mL

## 2015-10-07 MED ORDER — FENTANYL CITRATE (PF) 100 MCG/2ML IJ SOLN
50.0000 ug | INTRAMUSCULAR | Status: AC | PRN
  Administered 2015-10-07: 50 ug via INTRAVENOUS
  Administered 2015-10-07 (×2): 25 ug via INTRAVENOUS

## 2015-10-07 SURGICAL SUPPLY — 34 items
BLADE HEX COATED 2.75 (ELECTRODE) ×3 IMPLANT
BLADE SURG 15 STRL LF DISP TIS (BLADE) ×1 IMPLANT
BLADE SURG 15 STRL SS (BLADE) ×2
CHLORAPREP W/TINT 26ML (MISCELLANEOUS) ×3 IMPLANT
COVER BACK TABLE 60X90IN (DRAPES) ×3 IMPLANT
COVER MAYO STAND STRL (DRAPES) ×3 IMPLANT
DECANTER SPIKE VIAL GLASS SM (MISCELLANEOUS) IMPLANT
DERMABOND ADVANCED (GAUZE/BANDAGES/DRESSINGS) ×4
DERMABOND ADVANCED .7 DNX12 (GAUZE/BANDAGES/DRESSINGS) ×2 IMPLANT
DRAPE LAPAROTOMY 100X72 PEDS (DRAPES) ×3 IMPLANT
DRAPE UTILITY XL STRL (DRAPES) ×3 IMPLANT
DRSG TEGADERM 2-3/8X2-3/4 SM (GAUZE/BANDAGES/DRESSINGS) ×3 IMPLANT
ELECT REM PT RETURN 9FT ADLT (ELECTROSURGICAL) ×3
ELECTRODE REM PT RTRN 9FT ADLT (ELECTROSURGICAL) ×1 IMPLANT
GLOVE SURG SIGNA 7.5 PF LTX (GLOVE) ×3 IMPLANT
GOWN STRL REUS W/ TWL LRG LVL3 (GOWN DISPOSABLE) ×1 IMPLANT
GOWN STRL REUS W/ TWL XL LVL3 (GOWN DISPOSABLE) ×1 IMPLANT
GOWN STRL REUS W/TWL LRG LVL3 (GOWN DISPOSABLE) ×2
GOWN STRL REUS W/TWL XL LVL3 (GOWN DISPOSABLE) ×2
NEEDLE HYPO 25X1 1.5 SAFETY (NEEDLE) ×3 IMPLANT
NS IRRIG 1000ML POUR BTL (IV SOLUTION) ×3 IMPLANT
PACK BASIN DAY SURGERY FS (CUSTOM PROCEDURE TRAY) ×3 IMPLANT
PENCIL BUTTON HOLSTER BLD 10FT (ELECTRODE) ×3 IMPLANT
SLEEVE SCD COMPRESS KNEE MED (MISCELLANEOUS) IMPLANT
SPONGE GAUZE 2X2 8PLY STER LF (GAUZE/BANDAGES/DRESSINGS) ×1
SPONGE GAUZE 2X2 8PLY STRL LF (GAUZE/BANDAGES/DRESSINGS) ×2 IMPLANT
SUT MNCRL AB 4-0 PS2 18 (SUTURE) ×3 IMPLANT
SUT SILK 2 0 SH (SUTURE) ×3 IMPLANT
SUT VIC AB 3-0 SH 27 (SUTURE) ×2
SUT VIC AB 3-0 SH 27X BRD (SUTURE) ×1 IMPLANT
SYR BULB 3OZ (MISCELLANEOUS) IMPLANT
SYR CONTROL 10ML LL (SYRINGE) ×3 IMPLANT
TOWEL OR 17X24 6PK STRL BLUE (TOWEL DISPOSABLE) ×3 IMPLANT
TOWEL OR NON WOVEN STRL DISP B (DISPOSABLE) ×3 IMPLANT

## 2015-10-07 NOTE — Op Note (Signed)
REMOVAL PORT-A-CATH  Procedure Note  Maria Perez 10/07/2015   Pre-op Diagnosis: History of lymphoma     Post-op Diagnosis: same  Procedure(s): REMOVAL PORT-A-CATH  Surgeon(s): Coralie Keens, MD  Anesthesia: Monitor Anesthesia Care  Staff:  Circulator: Izora Ribas, RN; Gaetano Net, RN  Estimated Blood Loss: Minimal               Indications: This is a pleasant patient who had a Port-A-Cath placed for IV chemotherapy for treatment of lymphoma. She has completed therapy so the port is no longer needed so she is requesting removal.  Procedure: The patient was brought to the operative room and identified as the correct patient. She was placed supine on the operative table and anesthesia was induced. Her left chest was prepped and draped in the usual sterile fashion. I anesthetized the skin of the previous scar over the port site with 1% lidocaine. I made an incision to the previous scar with a scalpel. I then identified the port. I cut the 2 Prolene sutures and removed them. I then excised the scar tissue around the catheter and was able to remove the entire port and catheter intact. I then closed the catheter tract with a 3-0 Vicryl suture. I then closed the subcutaneous tissue with interrupted 3-0 Vicryl sutures and closed the skin with a running 4-0 Monocryl suture. Skin glue was then applied. The patient tolerated the procedure well. All the counts were correct at the end of the procedure. The patient was then taken in a stable condition to the recovery room.          Amyre Segundo A   Date: 10/07/2015  Time: 12:46 PM

## 2015-10-07 NOTE — Interval H&P Note (Signed)
History and Physical Interval Note:no change in H and P  10/07/2015 11:14 AM  Maria Perez  has presented today for surgery, with the diagnosis of History of lymphoma  The various methods of treatment have been discussed with the patient and family. After consideration of risks, benefits and other options for treatment, the patient has consented to  Procedure(s): REMOVAL PORT-A-CATH (N/A) as a surgical intervention .  The patient's history has been reviewed, patient examined, no change in status, stable for surgery.  I have reviewed the patient's chart and labs.  Questions were answered to the patient's satisfaction.     Randell Teare A

## 2015-10-07 NOTE — Transfer of Care (Signed)
Immediate Anesthesia Transfer of Care Note  Patient: Maria Perez  Procedure(s) Performed: Procedure(s) with comments: REMOVAL PORT-A-CATH (Left) - REMOVAL PORT-A-CATH  Patient Location: PACU  Anesthesia Type:MAC  Level of Consciousness: awake, alert  and oriented  Airway & Oxygen Therapy: Patient Spontanous Breathing and Patient connected to face mask oxygen  Post-op Assessment: Report given to RN, Post -op Vital signs reviewed and stable and Patient moving all extremities  Post vital signs: Reviewed and stable  Last Vitals:  Vitals:   10/07/15 1123 10/07/15 1250  BP: 130/78   Pulse: (!) 113 82  Resp: 20   Temp: 36.6 C     Last Pain:  Vitals:   10/07/15 1123  TempSrc: Oral         Complications: No apparent anesthesia complications

## 2015-10-07 NOTE — Discharge Instructions (Signed)
Ok to Pensions consultant if you experience:   1.  Fever over 101.0. 2.  Inability to urinate. 3.  Nausea and/or vomiting. 4.  Extreme swelling or bruising at the surgical site. 5.  Continued bleeding from the incision. 6.  Increased pain, redness or drainage from the incision. 7.  Problems related to your pain medication. 8.  Any problems and/or concerns.   Post Anesthesia Home Care Instructions  Activity: Get plenty of rest for the remainder of the day. A responsible adult should stay with you for 24 hours following the procedure.  For the next 24 hours, DO NOT: -Drive a car -Paediatric nurse -Drink alcoholic beverages -Take any medication unless instructed by your physician -Make any legal decisions or sign important papers.  Meals: Start with liquid foods such as gelatin or soup. Progress to regular foods as tolerated. Avoid greasy, spicy, heavy foods. If nausea and/or vomiting occur, drink only clear liquids until the nausea and/or vomiting subsides. Call your physician if vomiting continues.  Special Instructions/Symptoms: Your throat may feel dry or sore from the anesthesia or the breathing tube placed in your throat during surgery. If this causes discomfort, gargle with warm salt water. The discomfort should disappear within 24 hours.  If you had a scopolamine patch placed behind your ear for the management of post- operative nausea and/or vomiting:  1. The medication in the patch is effective for 72 hours, after which it should be removed.  Wrap patch in a tissue and discard in the trash. Wash hands thoroughly with soap and water. 2. You may remove the patch earlier than 72 hours if you experience unpleasant side effects which may include dry mouth, dizziness or visual disturbances. 3. Avoid touching the patch. Wash your hands with soap and water after contact with the patch.

## 2015-10-07 NOTE — Anesthesia Procedure Notes (Signed)
Procedure Name: MAC Date/Time: 10/07/2015 12:28 PM Performed by: Baxter Flattery Pre-anesthesia Checklist: Patient identified, Emergency Drugs available, Suction available and Patient being monitored Patient Re-evaluated:Patient Re-evaluated prior to inductionOxygen Delivery Method: Simple face mask Preoxygenation: Pre-oxygenation with 100% oxygen Intubation Type: IV induction Ventilation: Mask ventilation without difficulty Dental Injury: Teeth and Oropharynx as per pre-operative assessment

## 2015-10-07 NOTE — Anesthesia Postprocedure Evaluation (Signed)
Anesthesia Post Note  Patient: Maria Perez  Procedure(s) Performed: Procedure(s) (LRB): REMOVAL PORT-A-CATH (Left)  Patient location during evaluation: PACU Anesthesia Type: MAC Level of consciousness: awake and alert Pain management: pain level controlled Vital Signs Assessment: post-procedure vital signs reviewed and stable Respiratory status: spontaneous breathing, nonlabored ventilation and respiratory function stable Cardiovascular status: stable and blood pressure returned to baseline Anesthetic complications: no    Last Vitals:  Vitals:   10/07/15 1400 10/07/15 1410  BP: 110/68   Pulse: 67 70  Resp: 14 16  Temp:      Last Pain:  Vitals:   10/07/15 1123  TempSrc: Oral                 Drayce Tawil,W. EDMOND

## 2015-10-07 NOTE — Anesthesia Preprocedure Evaluation (Signed)
Anesthesia Evaluation  Patient identified by MRN, date of birth, ID band Patient awake    Reviewed: Allergy & Precautions, NPO status , Patient's Chart, lab work & pertinent test results  Airway Mallampati: I  TM Distance: >3 FB Neck ROM: Full    Dental  (+) Teeth Intact, Dental Advisory Given   Pulmonary neg pulmonary ROS,    breath sounds clear to auscultation       Cardiovascular negative cardio ROS   Rhythm:Regular Rate:Normal     Neuro/Psych negative neurological ROS     GI/Hepatic negative GI ROS, Neg liver ROS,   Endo/Other  negative endocrine ROS  Renal/GU negative Renal ROS     Musculoskeletal   Abdominal   Peds  Hematology negative hematology ROS (+)   Anesthesia Other Findings   Reproductive/Obstetrics                             Anesthesia Physical  Anesthesia Plan  ASA: II  Anesthesia Plan: MAC   Post-op Pain Management:    Induction: Intravenous  Airway Management Planned: Natural Airway and Simple Face Mask  Additional Equipment:   Intra-op Plan:   Post-operative Plan: Extubation in OR  Informed Consent: I have reviewed the patients History and Physical, chart, labs and discussed the procedure including the risks, benefits and alternatives for the proposed anesthesia with the patient or authorized representative who has indicated his/her understanding and acceptance.   Dental advisory given  Plan Discussed with: CRNA, Anesthesiologist and Surgeon  Anesthesia Plan Comments:         Anesthesia Quick Evaluation

## 2015-10-09 ENCOUNTER — Encounter (HOSPITAL_BASED_OUTPATIENT_CLINIC_OR_DEPARTMENT_OTHER): Payer: Self-pay | Admitting: Surgery

## 2015-11-23 DIAGNOSIS — Z0000 Encounter for general adult medical examination without abnormal findings: Secondary | ICD-10-CM | POA: Diagnosis not present

## 2015-11-23 DIAGNOSIS — Z01419 Encounter for gynecological examination (general) (routine) without abnormal findings: Secondary | ICD-10-CM | POA: Diagnosis not present

## 2015-11-23 DIAGNOSIS — Z1231 Encounter for screening mammogram for malignant neoplasm of breast: Secondary | ICD-10-CM | POA: Diagnosis not present

## 2015-11-23 DIAGNOSIS — C8330 Diffuse large B-cell lymphoma, unspecified site: Secondary | ICD-10-CM | POA: Diagnosis not present

## 2015-11-30 DIAGNOSIS — Z1231 Encounter for screening mammogram for malignant neoplasm of breast: Secondary | ICD-10-CM | POA: Diagnosis not present

## 2016-03-01 ENCOUNTER — Telehealth: Payer: Self-pay | Admitting: *Deleted

## 2016-03-01 NOTE — Telephone Encounter (Signed)
LM with note below 

## 2016-03-01 NOTE — Telephone Encounter (Signed)
Pt states she needs to have a hysterectomy. She wants to know if Dr Alvy Bimler feels it is OK to have the surgery

## 2016-03-01 NOTE — Telephone Encounter (Signed)
No problem from my stand point She needs surgical clearance from primary doctor

## 2016-03-02 DIAGNOSIS — N816 Rectocele: Secondary | ICD-10-CM | POA: Diagnosis not present

## 2016-03-16 DIAGNOSIS — Z01810 Encounter for preprocedural cardiovascular examination: Secondary | ICD-10-CM | POA: Diagnosis not present

## 2016-03-16 DIAGNOSIS — Z8572 Personal history of non-Hodgkin lymphomas: Secondary | ICD-10-CM | POA: Diagnosis not present

## 2016-03-16 DIAGNOSIS — R234 Changes in skin texture: Secondary | ICD-10-CM | POA: Diagnosis not present

## 2016-03-17 DIAGNOSIS — R234 Changes in skin texture: Secondary | ICD-10-CM | POA: Diagnosis not present

## 2016-03-17 DIAGNOSIS — Z8572 Personal history of non-Hodgkin lymphomas: Secondary | ICD-10-CM | POA: Diagnosis not present

## 2016-03-28 ENCOUNTER — Other Ambulatory Visit (HOSPITAL_BASED_OUTPATIENT_CLINIC_OR_DEPARTMENT_OTHER): Payer: Medicare Other

## 2016-03-28 ENCOUNTER — Ambulatory Visit (HOSPITAL_BASED_OUTPATIENT_CLINIC_OR_DEPARTMENT_OTHER): Payer: Medicare Other | Admitting: Hematology and Oncology

## 2016-03-28 VITALS — BP 127/77 | HR 82 | Temp 98.2°F | Resp 18 | Ht 65.0 in | Wt 124.9 lb

## 2016-03-28 DIAGNOSIS — R5383 Other fatigue: Secondary | ICD-10-CM | POA: Diagnosis not present

## 2016-03-28 DIAGNOSIS — C8338 Diffuse large B-cell lymphoma, lymph nodes of multiple sites: Secondary | ICD-10-CM | POA: Diagnosis not present

## 2016-03-28 LAB — COMPREHENSIVE METABOLIC PANEL
ALBUMIN: 4 g/dL (ref 3.5–5.0)
ALK PHOS: 116 U/L (ref 40–150)
ALT: 22 U/L (ref 0–55)
AST: 20 U/L (ref 5–34)
Anion Gap: 10 mEq/L (ref 3–11)
BILIRUBIN TOTAL: 0.41 mg/dL (ref 0.20–1.20)
BUN: 16 mg/dL (ref 7.0–26.0)
CALCIUM: 9.7 mg/dL (ref 8.4–10.4)
CO2: 27 mEq/L (ref 22–29)
Chloride: 105 mEq/L (ref 98–109)
Creatinine: 0.7 mg/dL (ref 0.6–1.1)
EGFR: 82 mL/min/{1.73_m2} — AB (ref >90)
Glucose: 90 mg/dl (ref 70–140)
POTASSIUM: 4.2 meq/L (ref 3.5–5.1)
Sodium: 142 mEq/L (ref 136–145)
TOTAL PROTEIN: 7 g/dL (ref 6.4–8.3)

## 2016-03-28 LAB — CBC WITH DIFFERENTIAL/PLATELET
BASO%: 0.8 % (ref 0.0–2.0)
BASOS ABS: 0.1 10*3/uL (ref 0.0–0.1)
EOS ABS: 0.2 10*3/uL (ref 0.0–0.5)
EOS%: 2.7 % (ref 0.0–7.0)
HEMATOCRIT: 40.7 % (ref 34.8–46.6)
HGB: 13.7 g/dL (ref 11.6–15.9)
LYMPH#: 1.2 10*3/uL (ref 0.9–3.3)
LYMPH%: 17.8 % (ref 14.0–49.7)
MCH: 30.5 pg (ref 25.1–34.0)
MCHC: 33.7 g/dL (ref 31.5–36.0)
MCV: 90.5 fL (ref 79.5–101.0)
MONO#: 0.6 10*3/uL (ref 0.1–0.9)
MONO%: 8.4 % (ref 0.0–14.0)
NEUT%: 70.3 % (ref 38.4–76.8)
NEUTROS ABS: 4.7 10*3/uL (ref 1.5–6.5)
Platelets: 310 10*3/uL (ref 145–400)
RBC: 4.5 10*6/uL (ref 3.70–5.45)
RDW: 13.3 % (ref 11.2–14.5)
WBC: 6.6 10*3/uL (ref 3.9–10.3)

## 2016-03-29 ENCOUNTER — Encounter: Payer: Self-pay | Admitting: Hematology and Oncology

## 2016-03-29 NOTE — Progress Notes (Signed)
[place] OFFICE PROGRESS NOTE  Patient Care Team: Signe Colt, MD as PCP - General (Obstetrics and Gynecology) Coralie Keens, MD as Consulting Physician (General Surgery)  SUMMARY OF ONCOLOGIC HISTORY:   Diffuse large b-cell lymphoma, lymph nodes of multiple sites [facility])   01/07/2015 Procedure    She had excisional biopsy of left axilla      01/07/2015 Pathology Results    Accession: [id number] showed follicular and DLBCL      01/19/2015 Imaging    PET scan showed stage III disease      01/20/2015 Bone Marrow Biopsy    BM biopsy showed normal cytogenetics with possible involvement of low grade lymphoma.  Accession: [id number]      01/22/2015 Imaging    ECHo showed normal EF      01/27/2015 - 05/13/2015 Chemotherapy    She received R-CHOP chemo x 6      02/12/2015 Procedure    She had port placement      03/30/2015 Imaging    PET CT scan showed positive response to treatment      06/30/2015 PET scan    Mildly hypermetabolic right lower paratracheal lymph node, which has slightly decreased in size and metabolism.      09/28/2015 Imaging    Ct chest showed low right paratracheal lymph node is stable in size. No additional evidence of recurrent lymphoma       INTERVAL HISTORY: Please see below for problem oriented charting. She returns with her husband for further follow-up. She denies new lymphadenopathy. Denies recent infection. She underwent recent hysterectomy for bladder prolapse and seems to be doing well after surgery. She complained of mild fatigue She denies residual neuropathy  REVIEW OF SYSTEMS:   Constitutional: Denies fevers, chills or abnormal weight loss Eyes: Denies blurriness of vision Ears, nose, mouth, throat, and face: Denies mucositis or sore throat Respiratory: Denies cough, dyspnea or wheezes Cardiovascular: Denies palpitation, chest discomfort or lower extremity swelling Gastrointestinal:  Denies nausea, heartburn or  change in bowel habits Skin: Denies abnormal skin rashes Lymphatics: Denies new lymphadenopathy or easy bruising Neurological:Denies numbness, tingling or new weaknesses Behavioral/Psych: Mood is stable, no new changes  All other systems were reviewed with the patient and are negative.  I have reviewed the past medical history, past surgical history, social history and family history with the patient and they are unchanged from previous note.  ALLERGIES:  has No Known Allergies.  MEDICATIONS:  Current Outpatient Prescriptions  Medication Sig Dispense Refill  . calcium carbonate (OSCAL) 1500 (600 CA) MG TABS tablet Take by mouth 2 (two) times daily with a meal.    . cetirizine (ZYRTEC) 10 MG tablet Take 10 mg by mouth daily.    . Cholecalciferol (VITAMIN D3 SUPER STRENGTH) 2000 units CAPS Take by mouth.    . Omega-3 Fatty Acids (FISH OIL) 1200 MG CAPS Take by mouth.    . traMADol (ULTRAM) 50 MG tablet Take 1-2 tablets (50-100 mg total) by mouth every 6 (six) hours as needed for moderate pain. (Patient not taking: Reported on 03/28/2016) 30 tablet 0   No current facility-administered medications for this visit.     PHYSICAL EXAMINATION: ECOG PERFORMANCE STATUS: 1 - Symptomatic but completely ambulatory  Vitals:   03/28/16 1315  BP: 127/77  Pulse: 82  Resp: 18  Temp: 98.2 F (36.8 C)   Filed Weights   03/28/16 1315  Weight: 124 lb 14.4 oz (56.7 kg)    GENERAL:alert, no distress and comfortable  SKIN: skin color, texture, turgor are normal, no rashes or significant lesions EYES: normal, Conjunctiva are pink and non-injected, sclera clear OROPHARYNX:no exudate, no erythema and lips, buccal mucosa, and tongue normal  NECK: supple, thyroid normal size, non-tender, without nodularity LYMPH:  no palpable lymphadenopathy in the cervical, axillary or inguinal LUNGS: clear to auscultation and percussion with normal breathing effort HEART: regular rate & rhythm and no murmurs and no  lower extremity edema ABDOMEN:abdomen soft, non-tender and normal bowel sounds Musculoskeletal:no cyanosis of digits and no clubbing  NEURO: alert & oriented x 3 with fluent speech, no focal motor/sensory deficits  LABORATORY DATA:  I have reviewed the data as listed    Component Value Date/Time   NA 142 03/28/2016 1301   K 4.2 03/28/2016 1301   CO2 27 03/28/2016 1301   GLUCOSE 90 03/28/2016 1301   BUN 16.0 03/28/2016 1301   CREATININE 0.7 03/28/2016 1301   CALCIUM 9.7 03/28/2016 1301   PROT 7.0 03/28/2016 1301   ALBUMIN 4.0 03/28/2016 1301   AST 20 03/28/2016 1301   ALT 22 03/28/2016 1301   ALKPHOS 116 03/28/2016 1301   BILITOT 0.41 03/28/2016 1301    No results found for: SPEP, UPEP  Lab Results  Component Value Date   WBC 6.6 03/28/2016   NEUTROABS 4.7 03/28/2016   HGB 13.7 03/28/2016   HCT 40.7 03/28/2016   MCV 90.5 03/28/2016   PLT 310 03/28/2016      Chemistry      Component Value Date/Time   NA 142 03/28/2016 1301   K 4.2 03/28/2016 1301   CO2 27 03/28/2016 1301   BUN 16.0 03/28/2016 1301   CREATININE 0.7 03/28/2016 1301      Component Value Date/Time   CALCIUM 9.7 03/28/2016 1301   ALKPHOS 116 03/28/2016 1301   AST 20 03/28/2016 1301   ALT 22 03/28/2016 1301   BILITOT 0.41 03/28/2016 1301      ASSESSMENT & PLAN:  Diffuse large b-cell lymphoma, lymph nodes of multiple sites [facility]) Her last PET/CT scan complete remission. Examination today is benign and blood work is unremarkable She has persistent mild right hilar lymphadenopathy and lung nodule in the lung, likely benign I will see her back in 3 months for repeat blood work, history, imaging study and physical examination.  Other fatigue She continues to have mild fatigue. She just finished her recent major surgery. I recommend she continue exercise program as tolerated.   Orders Placed This Encounter  Procedures  . CT CHEST W CONTRAST    Standing Status:   Future    Standing Expiration  Date:   05/02/2017    Order Specific Question:   Reason for exam:    Answer:   staging lymphoma, exclude recurrence    Order Specific Question:   Preferred imaging location?    Answer:   [facility]   All questions were answered. The patient knows to call the clinic with any problems, questions or concerns. No barriers to learning was detected. I spent 15 minutes counseling the patient face to face. The total time spent in the appointment was 20 minutes and more than 50% was on counseling and review of test results     Heath Lark, MD 03/29/2016 7:21 AM

## 2016-03-29 NOTE — Assessment & Plan Note (Addendum)
Her last PET/CT scan complete remission. Examination today is benign and blood work is unremarkable She has persistent mild right hilar lymphadenopathy and lung nodule in the lung, likely benign I will see her back in 3 months for repeat blood work, history, imaging study and physical examination.

## 2016-03-29 NOTE — Assessment & Plan Note (Signed)
She continues to have mild fatigue. She just finished her recent major surgery. I recommend she continue exercise program as tolerated.

## 2016-04-12 ENCOUNTER — Telehealth: Payer: Self-pay | Admitting: Hematology and Oncology

## 2016-04-12 NOTE — Telephone Encounter (Signed)
Left a message about June appointments and a call back number [phone]

## 2016-04-13 NOTE — Telephone Encounter (Signed)
Patient called to clarify her appointments.  Reviewed her appointments with her.  Let her know the time on her lab on 06/27/16 may change depending on what time her CT gets scheduled for.  She appreciated the clarification.

## 2016-06-27 ENCOUNTER — Encounter (HOSPITAL_COMMUNITY): Payer: Self-pay

## 2016-06-27 ENCOUNTER — Other Ambulatory Visit (HOSPITAL_BASED_OUTPATIENT_CLINIC_OR_DEPARTMENT_OTHER): Payer: Medicare Other

## 2016-06-27 ENCOUNTER — Ambulatory Visit (HOSPITAL_COMMUNITY)
Admission: RE | Admit: 2016-06-27 | Discharge: 2016-06-27 | Disposition: A | Discharge status: Home / Self Care | Payer: Medicare Other | Source: Ambulatory Visit | Attending: Hematology and Oncology | Admitting: Hematology and Oncology

## 2016-06-27 DIAGNOSIS — C8338 Diffuse large B-cell lymphoma, lymph nodes of multiple sites: Secondary | ICD-10-CM | POA: Diagnosis not present

## 2016-06-27 DIAGNOSIS — Z8572 Personal history of non-Hodgkin lymphomas: Secondary | ICD-10-CM | POA: Diagnosis not present

## 2016-06-27 DIAGNOSIS — I700 Atherosclerosis of aorta: Secondary | ICD-10-CM | POA: Diagnosis not present

## 2016-06-27 LAB — CBC WITH DIFFERENTIAL/PLATELET
BASO%: 0.9 % (ref 0.0–2.0)
Basophils Absolute: 0 10*3/uL (ref 0.0–0.1)
EOS ABS: 0.1 10*3/uL (ref 0.0–0.5)
EOS%: 1.6 % (ref 0.0–7.0)
HCT: 41.1 % (ref 34.8–46.6)
HEMOGLOBIN: 13.9 g/dL (ref 11.6–15.9)
LYMPH%: 26.4 % (ref 14.0–49.7)
MCH: 30.5 pg (ref 25.1–34.0)
MCHC: 33.9 g/dL (ref 31.5–36.0)
MCV: 90 fL (ref 79.5–101.0)
MONO#: 0.6 10*3/uL (ref 0.1–0.9)
MONO%: 13.1 % (ref 0.0–14.0)
NEUT%: 58 % (ref 38.4–76.8)
NEUTROS ABS: 2.5 10*3/uL (ref 1.5–6.5)
PLATELETS: 250 10*3/uL (ref 145–400)
RBC: 4.57 10*6/uL (ref 3.70–5.45)
RDW: 13.6 % (ref 11.2–14.5)
WBC: 4.4 10*3/uL (ref 3.9–10.3)
lymph#: 1.2 10*3/uL (ref 0.9–3.3)

## 2016-06-27 LAB — COMPREHENSIVE METABOLIC PANEL
ALBUMIN: 3.9 g/dL (ref 3.5–5.0)
ALK PHOS: 87 U/L (ref 40–150)
ALT: 20 U/L (ref 0–55)
AST: 23 U/L (ref 5–34)
Anion Gap: 8 mEq/L (ref 3–11)
BILIRUBIN TOTAL: 0.63 mg/dL (ref 0.20–1.20)
BUN: 13.9 mg/dL (ref 7.0–26.0)
CO2: 27 mEq/L (ref 22–29)
CREATININE: 0.8 mg/dL (ref 0.6–1.1)
Calcium: 9.4 mg/dL (ref 8.4–10.4)
Chloride: 108 mEq/L (ref 98–109)
EGFR: 71 mL/min/{1.73_m2} — AB (ref >90)
GLUCOSE: 93 mg/dL (ref 70–140)
Potassium: 4.4 mEq/L (ref 3.5–5.1)
SODIUM: 142 meq/L (ref 136–145)
TOTAL PROTEIN: 6.4 g/dL (ref 6.4–8.3)

## 2016-06-27 MED ORDER — IOPAMIDOL (ISOVUE-300) INJECTION 61%
75.0000 mL | Freq: Once | INTRAVENOUS | Status: AC | PRN
  Administered 2016-06-27: 75 mL via INTRAVENOUS

## 2016-06-27 MED ORDER — IOPAMIDOL (ISOVUE-300) INJECTION 61%
INTRAVENOUS | Status: AC
  Filled 2016-06-27: qty 75

## 2016-06-28 ENCOUNTER — Telehealth: Payer: Self-pay | Admitting: Hematology and Oncology

## 2016-06-28 ENCOUNTER — Ambulatory Visit (HOSPITAL_BASED_OUTPATIENT_CLINIC_OR_DEPARTMENT_OTHER): Payer: Medicare Other | Admitting: Hematology and Oncology

## 2016-06-28 ENCOUNTER — Other Ambulatory Visit: Payer: Self-pay

## 2016-06-28 ENCOUNTER — Encounter: Payer: Self-pay | Admitting: Hematology and Oncology

## 2016-06-28 DIAGNOSIS — C8338 Diffuse large B-cell lymphoma, lymph nodes of multiple sites: Secondary | ICD-10-CM

## 2016-06-28 DIAGNOSIS — R159 Full incontinence of feces: Secondary | ICD-10-CM | POA: Diagnosis not present

## 2016-06-28 DIAGNOSIS — R5383 Other fatigue: Secondary | ICD-10-CM | POA: Diagnosis not present

## 2016-06-28 DIAGNOSIS — R151 Fecal smearing: Secondary | ICD-10-CM

## 2016-06-28 NOTE — Telephone Encounter (Signed)
Scheduled appt per 6/19 los - Gave patient AVS and calender per LOS .  

## 2016-06-29 DIAGNOSIS — R159 Full incontinence of feces: Secondary | ICD-10-CM | POA: Insufficient documentation

## 2016-06-29 NOTE — Assessment & Plan Note (Signed)
She continues to have mild fatigue. We discussed increase physical activity and possible enrollment with the [employer] program with the [facility].

## 2016-06-29 NOTE — Assessment & Plan Note (Signed)
She had recent fecal incontinence I suspect that is related to recent pelvic surgery I recommend complete emptying of bowels with regular laxatives If that does not improve, I recommend referral to GI for further evaluation

## 2016-06-29 NOTE — Progress Notes (Signed)
[place] OFFICE PROGRESS NOTE  Patient Care Team: Signe Colt, MD as PCP - General (Obstetrics and Gynecology) Coralie Keens, MD as Consulting Physician (General Surgery)  SUMMARY OF ONCOLOGIC HISTORY:   Diffuse large b-cell lymphoma, lymph nodes of multiple sites [facility])   01/07/2015 Procedure    She had excisional biopsy of left axilla      01/07/2015 Pathology Results    Accession: [id number] showed follicular and DLBCL      01/19/2015 Imaging    PET scan showed stage III disease      01/20/2015 Bone Marrow Biopsy    BM biopsy showed normal cytogenetics with possible involvement of low grade lymphoma.  Accession: [id number]      01/22/2015 Imaging    ECHo showed normal EF      01/27/2015 - 05/13/2015 Chemotherapy    She received R-CHOP chemo x 6      02/12/2015 Procedure    She had port placement      03/30/2015 Imaging    PET CT scan showed positive response to treatment      06/30/2015 PET scan    Mildly hypermetabolic right lower paratracheal lymph node, which has slightly decreased in size and metabolism.      09/28/2015 Imaging    Ct chest showed low right paratracheal lymph node is stable in size. No additional evidence of recurrent lymphoma      06/28/2016 Imaging    1. Stable precarinal and left hilar lymph nodes when compared to prior study. No new adenopathy in the mediastinum or hilar regions. 2. No axillary or supraclavicular adenopathy. No upper abdominal adenopathy. 3. No significant pulmonary findings. 4. Aortic Atherosclerosis (ICD10-I70.0).       INTERVAL HISTORY: Please see below for problem oriented charting. She returns with family members for further follow-up She denies recent cough, chest pain or shortness of breath No recent infection She complain of recent fecal incontinence but not a consistent thing She denies recent constipation No new lymphadenopathy She complain of fatigue She has started to gain  weight  REVIEW OF SYSTEMS:   Constitutional: Denies fevers, chills or abnormal weight loss Eyes: Denies blurriness of vision Ears, nose, mouth, throat, and face: Denies mucositis or sore throat Respiratory: Denies cough, dyspnea or wheezes Cardiovascular: Denies palpitation, chest discomfort or lower extremity swelling Gastrointestinal:  Denies nausea, heartburn or change in bowel habits Skin: Denies abnormal skin rashes Lymphatics: Denies new lymphadenopathy or easy bruising Neurological:Denies numbness, tingling or new weaknesses Behavioral/Psych: Mood is stable, no new changes  All other systems were reviewed with the patient and are negative.  I have reviewed the past medical history, past surgical history, social history and family history with the patient and they are unchanged from previous note.  ALLERGIES:  has No Known Allergies.  MEDICATIONS:  Current Outpatient Prescriptions  Medication Sig Dispense Refill  . calcium carbonate (OSCAL) 1500 (600 CA) MG TABS tablet Take by mouth 2 (two) times daily with a meal.    . cetirizine (ZYRTEC) 10 MG tablet Take 10 mg by mouth daily.    . Cholecalciferol (VITAMIN D3 SUPER STRENGTH) 2000 units CAPS Take by mouth.    . Omega-3 Fatty Acids (FISH OIL) 1200 MG CAPS Take by mouth.    . traMADol (ULTRAM) 50 MG tablet Take 1-2 tablets (50-100 mg total) by mouth every 6 (six) hours as needed for moderate pain. (Patient not taking: Reported on 03/28/2016) 30 tablet 0   No current facility-administered medications for this  visit.     PHYSICAL EXAMINATION: ECOG PERFORMANCE STATUS: 1 - Symptomatic but completely ambulatory  Vitals:   06/28/16 1329  BP: 126/71  Pulse: 75  Resp: 18  Temp: 98.9 F (37.2 C)   Filed Weights   06/28/16 1329  Weight: 128 lb 12.8 oz (58.4 kg)    GENERAL:alert, no distress and comfortable SKIN: skin color, texture, turgor are normal, no rashes or significant lesions EYES: normal, Conjunctiva are pink and  non-injected, sclera clear OROPHARYNX:no exudate, no erythema and lips, buccal mucosa, and tongue normal  NECK: supple, thyroid normal size, non-tender, without nodularity LYMPH:  no palpable lymphadenopathy in the cervical, axillary or inguinal LUNGS: clear to auscultation and percussion with normal breathing effort HEART: regular rate & rhythm and no murmurs and no lower extremity edema ABDOMEN:abdomen soft, non-tender and normal bowel sounds Musculoskeletal:no cyanosis of digits and no clubbing  NEURO: alert & oriented x 3 with fluent speech, no focal motor/sensory deficits  LABORATORY DATA:  I have reviewed the data as listed    Component Value Date/Time   NA 142 06/27/2016 1239   K 4.4 06/27/2016 1239   CO2 27 06/27/2016 1239   GLUCOSE 93 06/27/2016 1239   BUN 13.9 06/27/2016 1239   CREATININE 0.8 06/27/2016 1239   CALCIUM 9.4 06/27/2016 1239   PROT 6.4 06/27/2016 1239   ALBUMIN 3.9 06/27/2016 1239   AST 23 06/27/2016 1239   ALT 20 06/27/2016 1239   ALKPHOS 87 06/27/2016 1239   BILITOT 0.63 06/27/2016 1239    No results found for: SPEP, UPEP  Lab Results  Component Value Date   WBC 4.4 06/27/2016   NEUTROABS 2.5 06/27/2016   HGB 13.9 06/27/2016   HCT 41.1 06/27/2016   MCV 90.0 06/27/2016   PLT 250 06/27/2016      Chemistry      Component Value Date/Time   NA 142 06/27/2016 1239   K 4.4 06/27/2016 1239   CO2 27 06/27/2016 1239   BUN 13.9 06/27/2016 1239   CREATININE 0.8 06/27/2016 1239      Component Value Date/Time   CALCIUM 9.4 06/27/2016 1239   ALKPHOS 87 06/27/2016 1239   AST 23 06/27/2016 1239   ALT 20 06/27/2016 1239   BILITOT 0.63 06/27/2016 1239       RADIOGRAPHIC STUDIES: I have personally reviewed the radiological images as listed and agreed with the findings in the report. Ct Chest W Contrast  Result Date: 06/28/2016 CLINICAL DATA:  History of lymphoma with initial diagnosis in 2016. Restaging examination. EXAM: CT CHEST WITH CONTRAST  TECHNIQUE: Multidetector CT imaging of the chest was performed during intravenous contrast administration. CONTRAST:  37m ISOVUE-300 IOPAMIDOL (ISOVUE-300) INJECTION 61% COMPARISON:  Chest CT 09/28/2015 and prior PET-CT 06/30/2015 FINDINGS: Cardiovascular: The heart is normal in size. No pericardial effusion. Stable mild tortuosity and atherosclerotic calcifications involving the thoracic aorta. No focal aneurysm or dissection. Stable scattered coronary artery calcifications. Mediastinum/Nodes: Stable 10 mm precarinal lymph node on image number 62. Stable 10 mm left hilar lymph node on image number 70. No new adenopathy. The esophagus is grossly normal. Lungs/Pleura: Stable biapical pleural and parenchymal scarring changes. No worrisome pulmonary lesions or acute pulmonary findings. No pleural effusion. Streaky areas of dependent subpleural atelectasis. Upper Abdomen: No significant upper abdominal findings. No adenopathy. Spleen is normal in size. Stable aortic calcifications. Chest wall/ Musculoskeletal: No chest wall mass, breast mass, supraclavicular or axillary lymphadenopathy. The thyroid gland is normal. No significant bony findings. IMPRESSION: 1. Stable precarinal and  left hilar lymph nodes when compared to prior study. No new adenopathy in the mediastinum or hilar regions. 2. No axillary or supraclavicular adenopathy. No upper abdominal adenopathy. 3. No significant pulmonary findings. Aortic Atherosclerosis (ICD10-I70.0). Electronically Signed   By: Marijo Sanes M.D.   On: 06/28/2016 08:38    ASSESSMENT & PLAN:  Diffuse large b-cell lymphoma, lymph nodes of multiple sites [facility]) Her last PET/CT scan complete remission. Recent CT scan from 06/27/2016 show no evidence of disease Examination today is benign and blood work is unremarkable I will see her back in 4 months for repeat blood work, history, imaging study and physical examination.  Other fatigue She continues to have mild fatigue. We  discussed increase physical activity and possible enrollment with the [employer] program with the [facility].  Incontinence of bowel She had recent fecal incontinence I suspect that is related to recent pelvic surgery I recommend complete emptying of bowels with regular laxatives If that does not improve, I recommend referral to GI for further evaluation   No orders of the defined types were placed in this encounter.  All questions were answered. The patient knows to call the clinic with any problems, questions or concerns. No barriers to learning was detected. I spent 15 minutes counseling the patient face to face. The total time spent in the appointment was 20 minutes and more than 50% was on counseling and review of test results     Heath Lark, MD 06/29/2016 4:48 PM

## 2016-06-29 NOTE — Assessment & Plan Note (Signed)
Her last PET/CT scan complete remission. Recent CT scan from 06/27/2016 show no evidence of disease Examination today is benign and blood work is unremarkable I will see her back in 4 months for repeat blood work, history, imaging study and physical examination.

## 2016-10-10 DIAGNOSIS — C44319 Basal cell carcinoma of skin of other parts of face: Secondary | ICD-10-CM | POA: Diagnosis not present

## 2016-10-10 DIAGNOSIS — L821 Other seborrheic keratosis: Secondary | ICD-10-CM | POA: Diagnosis not present

## 2016-10-10 DIAGNOSIS — L578 Other skin changes due to chronic exposure to nonionizing radiation: Secondary | ICD-10-CM | POA: Diagnosis not present

## 2016-10-10 DIAGNOSIS — D1801 Hemangioma of skin and subcutaneous tissue: Secondary | ICD-10-CM | POA: Diagnosis not present

## 2016-10-10 DIAGNOSIS — C44311 Basal cell carcinoma of skin of nose: Secondary | ICD-10-CM | POA: Diagnosis not present

## 2016-10-24 DIAGNOSIS — C44311 Basal cell carcinoma of skin of nose: Secondary | ICD-10-CM | POA: Diagnosis not present

## 2016-10-24 DIAGNOSIS — C44319 Basal cell carcinoma of skin of other parts of face: Secondary | ICD-10-CM | POA: Diagnosis not present

## 2016-10-25 ENCOUNTER — Encounter: Payer: Self-pay | Admitting: Hematology and Oncology

## 2016-10-25 ENCOUNTER — Ambulatory Visit (HOSPITAL_BASED_OUTPATIENT_CLINIC_OR_DEPARTMENT_OTHER): Payer: Medicare Other | Admitting: Hematology and Oncology

## 2016-10-25 ENCOUNTER — Other Ambulatory Visit (HOSPITAL_BASED_OUTPATIENT_CLINIC_OR_DEPARTMENT_OTHER): Payer: Medicare Other

## 2016-10-25 ENCOUNTER — Telehealth: Payer: Self-pay | Admitting: Hematology and Oncology

## 2016-10-25 VITALS — BP 114/71 | HR 77 | Temp 98.6°F | Resp 18 | Ht 65.0 in | Wt 132.5 lb

## 2016-10-25 DIAGNOSIS — Z85828 Personal history of other malignant neoplasm of skin: Secondary | ICD-10-CM

## 2016-10-25 DIAGNOSIS — C8338 Diffuse large B-cell lymphoma, lymph nodes of multiple sites: Secondary | ICD-10-CM

## 2016-10-25 DIAGNOSIS — Z23 Encounter for immunization: Secondary | ICD-10-CM

## 2016-10-25 DIAGNOSIS — R151 Fecal smearing: Secondary | ICD-10-CM

## 2016-10-25 LAB — CBC WITH DIFFERENTIAL/PLATELET
BASO%: 0.9 % (ref 0.0–2.0)
BASOS ABS: 0 10*3/uL (ref 0.0–0.1)
EOS%: 3.7 % (ref 0.0–7.0)
Eosinophils Absolute: 0.2 10*3/uL (ref 0.0–0.5)
HCT: 39.7 % (ref 34.8–46.6)
HGB: 13.4 g/dL (ref 11.6–15.9)
LYMPH%: 28.2 % (ref 14.0–49.7)
MCH: 30.7 pg (ref 25.1–34.0)
MCHC: 33.6 g/dL (ref 31.5–36.0)
MCV: 91.5 fL (ref 79.5–101.0)
MONO#: 0.5 10*3/uL (ref 0.1–0.9)
MONO%: 10.9 % (ref 0.0–14.0)
NEUT#: 2.6 10*3/uL (ref 1.5–6.5)
NEUT%: 56.3 % (ref 38.4–76.8)
Platelets: 259 10*3/uL (ref 145–400)
RBC: 4.34 10*6/uL (ref 3.70–5.45)
RDW: 13.3 % (ref 11.2–14.5)
WBC: 4.7 10*3/uL (ref 3.9–10.3)
lymph#: 1.3 10*3/uL (ref 0.9–3.3)

## 2016-10-25 LAB — COMPREHENSIVE METABOLIC PANEL
ALT: 16 U/L (ref 0–55)
ANION GAP: 10 meq/L (ref 3–11)
AST: 21 U/L (ref 5–34)
Albumin: 4 g/dL (ref 3.5–5.0)
Alkaline Phosphatase: 91 U/L (ref 40–150)
BILIRUBIN TOTAL: 0.49 mg/dL (ref 0.20–1.20)
BUN: 17.6 mg/dL (ref 7.0–26.0)
CHLORIDE: 105 meq/L (ref 98–109)
CO2: 26 meq/L (ref 22–29)
Calcium: 9.2 mg/dL (ref 8.4–10.4)
Creatinine: 0.8 mg/dL (ref 0.6–1.1)
EGFR: >60 mL/min/{1.73_m2} (ref >60)
Glucose: 93 mg/dl (ref 70–140)
POTASSIUM: 4.2 meq/L (ref 3.5–5.1)
Sodium: 140 mEq/L (ref 136–145)
Total Protein: 6.6 g/dL (ref 6.4–8.3)

## 2016-10-25 MED ORDER — INFLUENZA VAC SPLIT QUAD 0.5 ML IM SUSY
0.5000 mL | PREFILLED_SYRINGE | Freq: Once | INTRAMUSCULAR | Status: AC
  Administered 2016-10-25: 0.5 mL via INTRAMUSCULAR
  Filled 2016-10-25: qty 0.5

## 2016-10-25 NOTE — Telephone Encounter (Signed)
Gave avs and calendar for February 2019 °

## 2016-10-26 DIAGNOSIS — Z85828 Personal history of other malignant neoplasm of skin: Secondary | ICD-10-CM | POA: Insufficient documentation

## 2016-10-26 NOTE — Assessment & Plan Note (Signed)
She had recent diagnosis of skin cancer We discussed the importance of avoiding excessive sun exposure and wearing sunscreen She will close follow-up with her dermatologist I recommend vitamin D supplement

## 2016-10-26 NOTE — Assessment & Plan Note (Signed)
I discussed the risk and benefits of surveillance imaging Clinically, she has no signs of cancer recurrence Blood work is within normal limits She had no symptoms After much discussion, she is in agreement for surveillance history, physical examination and blood work only The patient is educated to call me if she have any signs and symptoms to suggest cancer recurrence such as abnormal weight loss, profuse night sweats, recurrent infection or new lymphadenopathy We discussed the importance of preventive care and reviewed the vaccination programs. She does not have any prior allergic reactions to influenza vaccination. She agrees to proceed with influenza vaccination today and we will administer it today at the clinic.

## 2016-10-26 NOTE — Assessment & Plan Note (Signed)
She had recent intermittent fecal incontinence I suspect that is related to recent pelvic surgery I recommend complete emptying of bowels with regular laxatives She will discuss further with her gyneacologist

## 2016-10-26 NOTE — Progress Notes (Signed)
[place] OFFICE PROGRESS NOTE  Patient Care Team: Signe Colt, MD as PCP - General (Obstetrics and Gynecology) Coralie Keens, MD as Consulting Physician (General Surgery)  SUMMARY OF ONCOLOGIC HISTORY:   Diffuse large b-cell lymphoma, lymph nodes of multiple sites [facility])   01/07/2015 Procedure    She had excisional biopsy of left axilla      01/07/2015 Pathology Results    Accession: [id number] showed follicular and DLBCL      01/19/2015 Imaging    PET scan showed stage III disease      01/20/2015 Bone Marrow Biopsy    BM biopsy showed normal cytogenetics with possible involvement of low grade lymphoma.  Accession: [id number]      01/22/2015 Imaging    ECHo showed normal EF      01/27/2015 - 05/13/2015 Chemotherapy    She received R-CHOP chemo x 6      02/12/2015 Procedure    She had port placement      03/30/2015 Imaging    PET CT scan showed positive response to treatment      06/30/2015 PET scan    Mildly hypermetabolic right lower paratracheal lymph node, which has slightly decreased in size and metabolism.      09/28/2015 Imaging    Ct chest showed low right paratracheal lymph node is stable in size. No additional evidence of recurrent lymphoma      06/28/2016 Imaging    1. Stable precarinal and left hilar lymph nodes when compared to prior study. No new adenopathy in the mediastinum or hilar regions. 2. No axillary or supraclavicular adenopathy. No upper abdominal adenopathy. 3. No significant pulmonary findings. 4. Aortic Atherosclerosis (ICD10-I70.0).       INTERVAL HISTORY: Please see below for problem oriented charting. She returns for further follow-up Denies recent infection No new lymphadenopathy She continues to have intermittent fecal incontinence She had recent skin surgery for basal cell carcinoma  REVIEW OF SYSTEMS:   Constitutional: Denies fevers, chills or abnormal weight loss Eyes: Denies blurriness of vision Ears,  nose, mouth, throat, and face: Denies mucositis or sore throat Respiratory: Denies cough, dyspnea or wheezes Cardiovascular: Denies palpitation, chest discomfort or lower extremity swelling Gastrointestinal:  Denies nausea, heartburn or change in bowel habits Lymphatics: Denies new lymphadenopathy or easy bruising Neurological:Denies numbness, tingling or new weaknesses Behavioral/Psych: Mood is stable, no new changes  All other systems were reviewed with the patient and are negative.  I have reviewed the past medical history, past surgical history, social history and family history with the patient and they are unchanged from previous note.  ALLERGIES:  has No Known Allergies.  MEDICATIONS:  Current Outpatient Prescriptions  Medication Sig Dispense Refill  . calcium carbonate (OSCAL) 1500 (600 CA) MG TABS tablet Take by mouth 2 (two) times daily with a meal.    . cetirizine (ZYRTEC) 10 MG tablet Take 10 mg by mouth daily.    . Cholecalciferol (VITAMIN D3 SUPER STRENGTH) 2000 units CAPS Take by mouth.    . Omega-3 Fatty Acids (FISH OIL) 1200 MG CAPS Take by mouth.    . traMADol (ULTRAM) 50 MG tablet Take 1-2 tablets (50-100 mg total) by mouth every 6 (six) hours as needed for moderate pain. (Patient not taking: Reported on 03/28/2016) 30 tablet 0   No current facility-administered medications for this visit.     PHYSICAL EXAMINATION: ECOG PERFORMANCE STATUS: 0  Vitals:   10/25/16 1540  BP: 114/71  Pulse: 77  Resp: 18  Temp: 98.6 F (37 C)  SpO2: 100%   Filed Weights   10/25/16 1540  Weight: 132 lb 8 oz (60.1 kg)    GENERAL:alert, no distress and comfortable SKIN: Noted well-healed surgical scars from recent skin surgery EYES: normal, Conjunctiva are pink and non-injected, sclera clear OROPHARYNX:no exudate, no erythema and lips, buccal mucosa, and tongue normal  NECK: supple, thyroid normal size, non-tender, without nodularity LYMPH:  no palpable lymphadenopathy in the  cervical, axillary or inguinal LUNGS: clear to auscultation and percussion with normal breathing effort HEART: regular rate & rhythm and no murmurs and no lower extremity edema ABDOMEN:abdomen soft, non-tender and normal bowel sounds Musculoskeletal:no cyanosis of digits and no clubbing  NEURO: alert & oriented x 3 with fluent speech, no focal motor/sensory deficits  LABORATORY DATA:  I have reviewed the data as listed    Component Value Date/Time   NA 140 10/25/2016 1457   K 4.2 10/25/2016 1457   CO2 26 10/25/2016 1457   GLUCOSE 93 10/25/2016 1457   BUN 17.6 10/25/2016 1457   CREATININE 0.8 10/25/2016 1457   CALCIUM 9.2 10/25/2016 1457   PROT 6.6 10/25/2016 1457   ALBUMIN 4.0 10/25/2016 1457   AST 21 10/25/2016 1457   ALT 16 10/25/2016 1457   ALKPHOS 91 10/25/2016 1457   BILITOT 0.49 10/25/2016 1457    No results found for: SPEP, UPEP  Lab Results  Component Value Date   WBC 4.7 10/25/2016   NEUTROABS 2.6 10/25/2016   HGB 13.4 10/25/2016   HCT 39.7 10/25/2016   MCV 91.5 10/25/2016   PLT 259 10/25/2016      Chemistry      Component Value Date/Time   NA 140 10/25/2016 1457   K 4.2 10/25/2016 1457   CO2 26 10/25/2016 1457   BUN 17.6 10/25/2016 1457   CREATININE 0.8 10/25/2016 1457      Component Value Date/Time   CALCIUM 9.2 10/25/2016 1457   ALKPHOS 91 10/25/2016 1457   AST 21 10/25/2016 1457   ALT 16 10/25/2016 1457   BILITOT 0.49 10/25/2016 1457       ASSESSMENT & PLAN:  Diffuse large b-cell lymphoma, lymph nodes of multiple sites ([place]) I discussed the risk and benefits of surveillance imaging Clinically, she has no signs of cancer recurrence Blood work is within normal limits She had no symptoms After much discussion, she is in agreement for surveillance history, physical examination and blood work only The patient is educated to call me if she have any signs and symptoms to suggest cancer recurrence such as abnormal weight loss, profuse night sweats,  recurrent infection or new lymphadenopathy We discussed the importance of preventive care and reviewed the vaccination programs. She does not have any prior allergic reactions to influenza vaccination. She agrees to proceed with influenza vaccination today and we will administer it today at the clinic.   Hx of skin cancer, basal cell She had recent diagnosis of skin cancer We discussed the importance of avoiding excessive sun exposure and wearing sunscreen She will close follow-up with her dermatologist I recommend vitamin D supplement  Incontinence of bowel She had recent intermittent fecal incontinence I suspect that is related to recent pelvic surgery I recommend complete emptying of bowels with regular laxatives She will discuss further with her gyneacologist   No orders of the defined types were placed in this encounter.  All questions were answered. The patient knows to call the clinic with any problems, questions or concerns. No barriers to learning  was detected. I spent 15 minutes counseling the patient face to face. The total time spent in the appointment was 20 minutes and more than 50% was on counseling and review of test results     Heath Lark, MD 10/26/2016 6:46 AM

## 2016-11-08 DIAGNOSIS — C44311 Basal cell carcinoma of skin of nose: Secondary | ICD-10-CM | POA: Diagnosis not present

## 2016-11-08 DIAGNOSIS — C44319 Basal cell carcinoma of skin of other parts of face: Secondary | ICD-10-CM | POA: Diagnosis not present

## 2016-11-08 DIAGNOSIS — L820 Inflamed seborrheic keratosis: Secondary | ICD-10-CM | POA: Diagnosis not present

## 2016-11-08 DIAGNOSIS — L720 Epidermal cyst: Secondary | ICD-10-CM | POA: Diagnosis not present

## 2016-11-25 DIAGNOSIS — Z1231 Encounter for screening mammogram for malignant neoplasm of breast: Secondary | ICD-10-CM | POA: Diagnosis not present

## 2016-11-25 DIAGNOSIS — Z01419 Encounter for gynecological examination (general) (routine) without abnormal findings: Secondary | ICD-10-CM | POA: Diagnosis not present

## 2016-12-27 DIAGNOSIS — Z1231 Encounter for screening mammogram for malignant neoplasm of breast: Secondary | ICD-10-CM | POA: Diagnosis not present

## 2017-02-24 ENCOUNTER — Encounter: Payer: Self-pay | Admitting: Hematology and Oncology

## 2017-02-24 ENCOUNTER — Inpatient Hospital Stay: Payer: Medicare Other | Attending: Hematology and Oncology | Admitting: Hematology and Oncology

## 2017-02-24 ENCOUNTER — Inpatient Hospital Stay: Payer: Medicare Other

## 2017-02-24 ENCOUNTER — Telehealth: Payer: Self-pay | Admitting: Hematology and Oncology

## 2017-02-24 VITALS — BP 128/73 | HR 73 | Temp 98.1°F | Resp 18 | Ht 65.0 in | Wt 133.7 lb

## 2017-02-24 DIAGNOSIS — Z85828 Personal history of other malignant neoplasm of skin: Secondary | ICD-10-CM | POA: Diagnosis not present

## 2017-02-24 DIAGNOSIS — C8338 Diffuse large B-cell lymphoma, lymph nodes of multiple sites: Secondary | ICD-10-CM

## 2017-02-24 DIAGNOSIS — Z8572 Personal history of non-Hodgkin lymphomas: Secondary | ICD-10-CM

## 2017-02-24 DIAGNOSIS — Z8579 Personal history of other malignant neoplasms of lymphoid, hematopoietic and related tissues: Secondary | ICD-10-CM

## 2017-02-24 LAB — CBC WITH DIFFERENTIAL/PLATELET
BASOS ABS: 0 10*3/uL (ref 0.0–0.1)
Basophils Relative: 1 %
EOS PCT: 1 %
Eosinophils Absolute: 0.1 10*3/uL (ref 0.0–0.5)
HCT: 40.6 % (ref 34.8–46.6)
Hemoglobin: 13.6 g/dL (ref 11.6–15.9)
LYMPHS PCT: 30 %
Lymphs Abs: 1.4 10*3/uL (ref 0.9–3.3)
MCH: 30.2 pg (ref 25.1–34.0)
MCHC: 33.4 g/dL (ref 31.5–36.0)
MCV: 90.3 fL (ref 79.5–101.0)
Monocytes Absolute: 0.5 10*3/uL (ref 0.1–0.9)
Monocytes Relative: 11 %
NEUTROS PCT: 57 %
Neutro Abs: 2.8 10*3/uL (ref 1.5–6.5)
PLATELETS: 287 10*3/uL (ref 145–400)
RBC: 4.49 MIL/uL (ref 3.70–5.45)
RDW: 13.3 % (ref 11.2–14.5)
WBC: 4.9 10*3/uL (ref 3.9–10.3)

## 2017-02-24 LAB — COMPREHENSIVE METABOLIC PANEL
ALT: 16 U/L (ref 0–55)
ANION GAP: 9 (ref 3–11)
AST: 22 U/L (ref 5–34)
Albumin: 4 g/dL (ref 3.5–5.0)
Alkaline Phosphatase: 90 U/L (ref 40–150)
BILIRUBIN TOTAL: 0.6 mg/dL (ref 0.2–1.2)
BUN: 20 mg/dL (ref 7–26)
CO2: 27 mmol/L (ref 22–29)
Calcium: 9.5 mg/dL (ref 8.4–10.4)
Chloride: 105 mmol/L (ref 98–109)
Creatinine, Ser: 0.79 mg/dL (ref 0.60–1.10)
Glucose, Bld: 89 mg/dL (ref 70–140)
POTASSIUM: 4.5 mmol/L (ref 3.5–5.1)
Sodium: 141 mmol/L (ref 136–145)
TOTAL PROTEIN: 6.5 g/dL (ref 6.4–8.3)

## 2017-02-24 NOTE — Assessment & Plan Note (Signed)
I discussed the risk and benefits of surveillance imaging Clinically, she has no signs of cancer recurrence, although, she did have some back pain of unknown reason. Blood work is within normal limits I recommend seeing her back in 3 months with repeat CT scan of the chest, abdomen and pelvis.  By then, she would have been 2 years away from last chemotherapy. If she has no cancer recurrence, we will space out her follow-up appointment to every 6 months

## 2017-02-24 NOTE — Progress Notes (Signed)
[place] OFFICE PROGRESS NOTE  Patient Care Team: Signe Colt, MD as PCP - General (Obstetrics and Gynecology) Coralie Keens, MD as Consulting Physician (General Surgery)  ASSESSMENT & PLAN:  History of lymphoma I discussed the risk and benefits of surveillance imaging Clinically, she has no signs of cancer recurrence, although, she did have some back pain of unknown reason. Blood work is within normal limits I recommend seeing her back in 3 months with repeat CT scan of the chest, abdomen and pelvis.  By then, she would have been 2 years away from last chemotherapy. If she has no cancer recurrence, we will space out her follow-up appointment to every 6 months  Hx of skin cancer, basal cell She had recent diagnosis of skin cancer We discussed the importance of avoiding excessive sun exposure and wearing sunscreen She will close follow-up with her dermatologist   Orders Placed This Encounter  Procedures  . CT CHEST W CONTRAST    Standing Status:   Future    Standing Expiration Date:   02/24/2018    Order Specific Question:   If indicated for the ordered procedure, I authorize the administration of contrast media per Radiology protocol    Answer:   Yes    Order Specific Question:   Preferred imaging location?    Answer:   [facility]    Order Specific Question:   Radiology Contrast Protocol - do NOT remove file path    Answer:   [email or web address]  . CT ABDOMEN PELVIS W CONTRAST    Standing Status:   Future    Standing Expiration Date:   02/24/2018    Order Specific Question:   If indicated for the ordered procedure, I authorize the administration of contrast media per Radiology protocol    Answer:   Yes    Order Specific Question:   Preferred imaging location?    Answer:   [facility]    Order Specific Question:   Radiology Contrast Protocol - do NOT remove file path    Answer:    [email or web address]    INTERVAL HISTORY: Please see below for problem oriented charting. She returns with her husband for further follow-up She feels well She has gained some weight She complained of intermittent lower back pain that comes and goes She denies new lymphadenopathy No recent infection.  SUMMARY OF ONCOLOGIC HISTORY:   History of lymphoma   01/07/2015 Procedure    She had excisional biopsy of left axilla      01/07/2015 Pathology Results    Accession: [id number] showed follicular and DLBCL      01/19/2015 Imaging    PET scan showed stage III disease      01/20/2015 Bone Marrow Biopsy    BM biopsy showed normal cytogenetics with possible involvement of low grade lymphoma.  Accession: [id number]      01/22/2015 Imaging    ECHo showed normal EF      01/27/2015 - 05/13/2015 Chemotherapy    She received R-CHOP chemo x 6      02/12/2015 Procedure    She had port placement      03/30/2015 Imaging    PET CT scan showed positive response to treatment      06/30/2015 PET scan    Mildly hypermetabolic right lower paratracheal lymph node, which has slightly decreased in size and metabolism.      09/28/2015 Imaging    Ct chest showed low right paratracheal lymph node is stable in size.  No additional evidence of recurrent lymphoma      06/28/2016 Imaging    1. Stable precarinal and left hilar lymph nodes when compared to prior study. No new adenopathy in the mediastinum or hilar regions. 2. No axillary or supraclavicular adenopathy. No upper abdominal adenopathy. 3. No significant pulmonary findings. 4. Aortic Atherosclerosis (ICD10-I70.0).       REVIEW OF SYSTEMS:   Constitutional: Denies fevers, chills or abnormal weight loss Eyes: Denies blurriness of vision Ears, nose, mouth, throat, and face: Denies mucositis or sore throat Respiratory: Denies cough, dyspnea or wheezes Cardiovascular: Denies palpitation, chest discomfort or lower  extremity swelling Gastrointestinal:  Denies nausea, heartburn or change in bowel habits Skin: Denies abnormal skin rashes Lymphatics: Denies new lymphadenopathy or easy bruising Neurological:Denies numbness, tingling or new weaknesses Behavioral/Psych: Mood is stable, no new changes  All other systems were reviewed with the patient and are negative.  I have reviewed the past medical history, past surgical history, social history and family history with the patient and they are unchanged from previous note.  ALLERGIES:  has No Known Allergies.  MEDICATIONS:  Current Outpatient Medications  Medication Sig Dispense Refill  . calcium carbonate (OSCAL) 1500 (600 CA) MG TABS tablet Take by mouth 2 (two) times daily with a meal.    . cetirizine (ZYRTEC) 10 MG tablet Take 10 mg by mouth daily.    . Cholecalciferol (VITAMIN D3 SUPER STRENGTH) 2000 units CAPS Take by mouth.    . Omega-3 Fatty Acids (FISH OIL) 1200 MG CAPS Take by mouth.    . traMADol (ULTRAM) 50 MG tablet Take 1-2 tablets (50-100 mg total) by mouth every 6 (six) hours as needed for moderate pain. (Patient not taking: Reported on 03/28/2016) 30 tablet 0   No current facility-administered medications for this visit.     PHYSICAL EXAMINATION: ECOG PERFORMANCE STATUS: 1 - Symptomatic but completely ambulatory  Vitals:   02/24/17 1448  BP: 128/73  Pulse: 73  Resp: 18  Temp: 98.1 F (36.7 C)  SpO2: 100%   Filed Weights   02/24/17 1448  Weight: 133 lb 11.2 oz (60.6 kg)    GENERAL:alert, no distress and comfortable SKIN: skin color, texture, turgor are normal, no rashes or significant lesions EYES: normal, Conjunctiva are pink and non-injected, sclera clear OROPHARYNX:no exudate, no erythema and lips, buccal mucosa, and tongue normal  NECK: supple, thyroid normal size, non-tender, without nodularity LYMPH:  no palpable lymphadenopathy in the cervical, axillary or inguinal LUNGS: clear to auscultation and percussion with  normal breathing effort HEART: regular rate & rhythm and no murmurs and no lower extremity edema ABDOMEN:abdomen soft, non-tender and normal bowel sounds Musculoskeletal:no cyanosis of digits and no clubbing  NEURO: alert & oriented x 3 with fluent speech, no focal motor/sensory deficits  LABORATORY DATA:  I have reviewed the data as listed    Component Value Date/Time   NA 141 02/24/2017 1421   NA 140 10/25/2016 1457   K 4.5 02/24/2017 1421   K 4.2 10/25/2016 1457   CL 105 02/24/2017 1421   CO2 27 02/24/2017 1421   CO2 26 10/25/2016 1457   GLUCOSE 89 02/24/2017 1421   GLUCOSE 93 10/25/2016 1457   BUN 20 02/24/2017 1421   BUN 17.6 10/25/2016 1457   CREATININE 0.79 02/24/2017 1421   CREATININE 0.8 10/25/2016 1457   CALCIUM 9.5 02/24/2017 1421   CALCIUM 9.2 10/25/2016 1457   PROT 6.5 02/24/2017 1421   PROT 6.6 10/25/2016 1457   ALBUMIN 4.0 02/24/2017  1421   ALBUMIN 4.0 10/25/2016 1457   AST 22 02/24/2017 1421   AST 21 10/25/2016 1457   ALT 16 02/24/2017 1421   ALT 16 10/25/2016 1457   ALKPHOS 90 02/24/2017 1421   ALKPHOS 91 10/25/2016 1457   BILITOT 0.6 02/24/2017 1421   BILITOT 0.49 10/25/2016 1457   GFRNONAA >60 02/24/2017 1421   GFRAA >60 02/24/2017 1421    No results found for: SPEP, UPEP  Lab Results  Component Value Date   WBC 4.9 02/24/2017   NEUTROABS 2.8 02/24/2017   HGB 13.6 02/24/2017   HCT 40.6 02/24/2017   MCV 90.3 02/24/2017   PLT 287 02/24/2017      Chemistry      Component Value Date/Time   NA 141 02/24/2017 1421   NA 140 10/25/2016 1457   K 4.5 02/24/2017 1421   K 4.2 10/25/2016 1457   CL 105 02/24/2017 1421   CO2 27 02/24/2017 1421   CO2 26 10/25/2016 1457   BUN 20 02/24/2017 1421   BUN 17.6 10/25/2016 1457   CREATININE 0.79 02/24/2017 1421   CREATININE 0.8 10/25/2016 1457      Component Value Date/Time   CALCIUM 9.5 02/24/2017 1421   CALCIUM 9.2 10/25/2016 1457   ALKPHOS 90 02/24/2017 1421   ALKPHOS 91 10/25/2016 1457   AST 22  02/24/2017 1421   AST 21 10/25/2016 1457   ALT 16 02/24/2017 1421   ALT 16 10/25/2016 1457   BILITOT 0.6 02/24/2017 1421   BILITOT 0.49 10/25/2016 1457      All questions were answered. The patient knows to call the clinic with any problems, questions or concerns. No barriers to learning was detected.  I spent 15 minutes counseling the patient face to face. The total time spent in the appointment was 20 minutes and more than 50% was on counseling and review of test results  Heath Lark, MD 02/24/2017 3:19 PM

## 2017-02-24 NOTE — Assessment & Plan Note (Signed)
She had recent diagnosis of skin cancer We discussed the importance of avoiding excessive sun exposure and wearing sunscreen She will close follow-up with her dermatologist

## 2017-02-24 NOTE — Telephone Encounter (Signed)
Gave patient AVs and calendar of upcoming May appointments.  °

## 2017-03-21 DIAGNOSIS — L578 Other skin changes due to chronic exposure to nonionizing radiation: Secondary | ICD-10-CM | POA: Diagnosis not present

## 2017-03-21 DIAGNOSIS — L720 Epidermal cyst: Secondary | ICD-10-CM | POA: Diagnosis not present

## 2017-03-21 DIAGNOSIS — L820 Inflamed seborrheic keratosis: Secondary | ICD-10-CM | POA: Diagnosis not present

## 2017-03-21 DIAGNOSIS — L821 Other seborrheic keratosis: Secondary | ICD-10-CM | POA: Diagnosis not present

## 2017-05-24 ENCOUNTER — Inpatient Hospital Stay: Payer: Medicare Other | Attending: Hematology and Oncology

## 2017-05-24 ENCOUNTER — Ambulatory Visit (HOSPITAL_COMMUNITY)
Admission: RE | Admit: 2017-05-24 | Discharge: 2017-05-24 | Disposition: A | Discharge status: Home / Self Care | Payer: Medicare Other | Source: Ambulatory Visit | Attending: Hematology and Oncology | Admitting: Hematology and Oncology

## 2017-05-24 ENCOUNTER — Encounter (HOSPITAL_COMMUNITY): Payer: Self-pay

## 2017-05-24 DIAGNOSIS — Z8579 Personal history of other malignant neoplasms of lymphoid, hematopoietic and related tissues: Secondary | ICD-10-CM | POA: Insufficient documentation

## 2017-05-24 DIAGNOSIS — I700 Atherosclerosis of aorta: Secondary | ICD-10-CM | POA: Diagnosis not present

## 2017-05-24 DIAGNOSIS — R590 Localized enlarged lymph nodes: Secondary | ICD-10-CM | POA: Diagnosis not present

## 2017-05-24 DIAGNOSIS — J929 Pleural plaque without asbestos: Secondary | ICD-10-CM | POA: Diagnosis not present

## 2017-05-24 DIAGNOSIS — C8338 Diffuse large B-cell lymphoma, lymph nodes of multiple sites: Secondary | ICD-10-CM

## 2017-05-24 DIAGNOSIS — I2510 Atherosclerotic heart disease of native coronary artery without angina pectoris: Secondary | ICD-10-CM | POA: Insufficient documentation

## 2017-05-24 LAB — COMPREHENSIVE METABOLIC PANEL
ALT: 28 U/L (ref 0–55)
AST: 26 U/L (ref 5–34)
Albumin: 4.4 g/dL (ref 3.5–5.0)
Alkaline Phosphatase: 108 U/L (ref 40–150)
Anion gap: 5 (ref 3–11)
BUN: 17 mg/dL (ref 7–26)
CALCIUM: 9.6 mg/dL (ref 8.4–10.4)
CHLORIDE: 104 mmol/L (ref 98–109)
CO2: 30 mmol/L — ABNORMAL HIGH (ref 22–29)
Creatinine, Ser: 0.86 mg/dL (ref 0.60–1.10)
GFR calc non Af Amer: >60 mL/min (ref >60)
GLUCOSE: 99 mg/dL (ref 70–140)
POTASSIUM: 4.4 mmol/L (ref 3.5–5.1)
Sodium: 139 mmol/L (ref 136–145)
TOTAL PROTEIN: 7.1 g/dL (ref 6.4–8.3)
Total Bilirubin: 0.5 mg/dL (ref 0.2–1.2)

## 2017-05-24 LAB — CBC WITH DIFFERENTIAL/PLATELET
BASOS ABS: 0 10*3/uL (ref 0.0–0.1)
BASOS PCT: 1 %
Eosinophils Absolute: 0.1 10*3/uL (ref 0.0–0.5)
Eosinophils Relative: 2 %
HEMATOCRIT: 43.3 % (ref 34.8–46.6)
Hemoglobin: 14.7 g/dL (ref 11.6–15.9)
LYMPHS PCT: 27 %
Lymphs Abs: 1.3 10*3/uL (ref 0.9–3.3)
MCH: 30.5 pg (ref 25.1–34.0)
MCHC: 33.9 g/dL (ref 31.5–36.0)
MCV: 90 fL (ref 79.5–101.0)
Monocytes Absolute: 0.7 10*3/uL (ref 0.1–0.9)
Monocytes Relative: 13 %
NEUTROS ABS: 2.8 10*3/uL (ref 1.5–6.5)
NEUTROS PCT: 57 %
Platelets: 269 10*3/uL (ref 145–400)
RBC: 4.82 MIL/uL (ref 3.70–5.45)
RDW: 13 % (ref 11.2–14.5)
WBC: 4.9 10*3/uL (ref 3.9–10.3)

## 2017-05-24 MED ORDER — IOPAMIDOL (ISOVUE-300) INJECTION 61%
100.0000 mL | Freq: Once | INTRAVENOUS | Status: AC | PRN
  Administered 2017-05-24: 100 mL via INTRAVENOUS

## 2017-05-24 MED ORDER — IOPAMIDOL (ISOVUE-300) INJECTION 61%
INTRAVENOUS | Status: AC
  Filled 2017-05-24: qty 100

## 2017-05-25 ENCOUNTER — Inpatient Hospital Stay (HOSPITAL_BASED_OUTPATIENT_CLINIC_OR_DEPARTMENT_OTHER): Payer: Medicare Other | Admitting: Hematology and Oncology

## 2017-05-25 ENCOUNTER — Telehealth: Payer: Self-pay | Admitting: Hematology and Oncology

## 2017-05-25 DIAGNOSIS — Z8579 Personal history of other malignant neoplasms of lymphoid, hematopoietic and related tissues: Secondary | ICD-10-CM

## 2017-05-25 NOTE — Telephone Encounter (Signed)
Gave pt avs and calendar with [facility] per 5/16 los

## 2017-05-26 ENCOUNTER — Encounter: Payer: Self-pay | Admitting: Hematology and Oncology

## 2017-05-26 NOTE — Assessment & Plan Note (Signed)
I have reviewed imaging study and blood work with the patient Clinically, she had no signs of cancer recurrence Her exam is benign I recommend close follow-up with primary care doctor and to continue age-appropriate screening modalities I reinforced the importance of annual influenza vaccination The patient is educated to watch out for signs and symptoms of cancer recurrence I plan to see her back once a year with history, physical examination and blood work only and to defer imaging studies unless indication arise

## 2017-05-26 NOTE — Progress Notes (Signed)
[place] OFFICE PROGRESS NOTE  Patient Care Team: Cher Nakai, MD as PCP - General (Internal Medicine) Coralie Keens, MD as Consulting Physician (General Surgery)  ASSESSMENT & PLAN:  History of lymphoma I have reviewed imaging study and blood work with the patient Clinically, she had no signs of cancer recurrence Her exam is benign I recommend close follow-up with primary care doctor and to continue age-appropriate screening modalities I reinforced the importance of annual influenza vaccination The patient is educated to watch out for signs and symptoms of cancer recurrence I plan to see her back once a year with history, physical examination and blood work only and to defer imaging studies unless indication arise   No orders of the defined types were placed in this encounter.   INTERVAL HISTORY: Please see below for problem oriented charting. She returns for further follow-up She denies new lymphadenopathy No recent abnormal anorexia, weight loss or night sweats She denies recent infection  SUMMARY OF ONCOLOGIC HISTORY:   History of lymphoma   01/07/2015 Procedure    She had excisional biopsy of left axilla      01/07/2015 Pathology Results    Accession: [id number] showed follicular and DLBCL      01/19/2015 Imaging    PET scan showed stage III disease      01/20/2015 Bone Marrow Biopsy    BM biopsy showed normal cytogenetics with possible involvement of low grade lymphoma.  Accession: [id number]      01/22/2015 Imaging    ECHo showed normal EF      01/27/2015 - 05/13/2015 Chemotherapy    She received R-CHOP chemo x 6      02/12/2015 Procedure    She had port placement      03/30/2015 Imaging    PET CT scan showed positive response to treatment      06/30/2015 PET scan    Mildly hypermetabolic right lower paratracheal lymph node, which has slightly decreased in size and metabolism.      09/28/2015 Imaging    Ct chest showed low right  paratracheal lymph node is stable in size. No additional evidence of recurrent lymphoma      06/28/2016 Imaging    1. Stable precarinal and left hilar lymph nodes when compared to prior study. No new adenopathy in the mediastinum or hilar regions. 2. No axillary or supraclavicular adenopathy. No upper abdominal adenopathy. 3. No significant pulmonary findings. 4. Aortic Atherosclerosis (ICD10-I70.0).      05/24/2017 Imaging    Chest Impression:  1. No evidence of lymphoma recurrence. 2. Stable biapical pleural thickening. 3. Mild Coronary artery calcification and Aortic Atherosclerosis (ICD10-I70.0).  Abdomen / Pelvis Impression:  No lymphoma recurrence in the abdomen pelvis.       REVIEW OF SYSTEMS:   Constitutional: Denies fevers, chills or abnormal weight loss Eyes: Denies blurriness of vision Ears, nose, mouth, throat, and face: Denies mucositis or sore throat Respiratory: Denies cough, dyspnea or wheezes Cardiovascular: Denies palpitation, chest discomfort or lower extremity swelling Gastrointestinal:  Denies nausea, heartburn or change in bowel habits Skin: Denies abnormal skin rashes Lymphatics: Denies new lymphadenopathy or easy bruising Neurological:Denies numbness, tingling or new weaknesses Behavioral/Psych: Mood is stable, no new changes  All other systems were reviewed with the patient and are negative.  I have reviewed the past medical history, past surgical history, social history and family history with the patient and they are unchanged from previous note.  ALLERGIES:  has No Known Allergies.  MEDICATIONS:  Current  Outpatient Medications  Medication Sig Dispense Refill  . calcium carbonate (OSCAL) 1500 (600 CA) MG TABS tablet Take by mouth 2 (two) times daily with a meal.    . cetirizine (ZYRTEC) 10 MG tablet Take 10 mg by mouth daily.    . Cholecalciferol (VITAMIN D3 SUPER STRENGTH) 2000 units CAPS Take by mouth.    . Omega-3 Fatty Acids (FISH OIL) 1200  MG CAPS Take by mouth.    . traMADol (ULTRAM) 50 MG tablet Take 1-2 tablets (50-100 mg total) by mouth every 6 (six) hours as needed for moderate pain. (Patient not taking: Reported on 03/28/2016) 30 tablet 0   No current facility-administered medications for this visit.     PHYSICAL EXAMINATION: ECOG PERFORMANCE STATUS: 0 - Asymptomatic  Vitals:   05/25/17 1457  BP: 96/73  Pulse: 94  Resp: 18  Temp: 98.4 F (36.9 C)  SpO2: 97%   Filed Weights   05/25/17 1457  Weight: 134 lb 4.8 oz (60.9 kg)    GENERAL:alert, no distress and comfortable SKIN: skin color, texture, turgor are normal, no rashes or significant lesions EYES: normal, Conjunctiva are pink and non-injected, sclera clear OROPHARYNX:no exudate, no erythema and lips, buccal mucosa, and tongue normal  NECK: supple, thyroid normal size, non-tender, without nodularity LYMPH:  no palpable lymphadenopathy in the cervical, axillary or inguinal LUNGS: clear to auscultation and percussion with normal breathing effort HEART: regular rate & rhythm and no murmurs and no lower extremity edema ABDOMEN:abdomen soft, non-tender and normal bowel sounds Musculoskeletal:no cyanosis of digits and no clubbing  NEURO: alert & oriented x 3 with fluent speech, no focal motor/sensory deficits  LABORATORY DATA:  I have reviewed the data as listed    Component Value Date/Time   NA 139 05/24/2017 1308   NA 140 10/25/2016 1457   K 4.4 05/24/2017 1308   K 4.2 10/25/2016 1457   CL 104 05/24/2017 1308   CO2 30 (H) 05/24/2017 1308   CO2 26 10/25/2016 1457   GLUCOSE 99 05/24/2017 1308   GLUCOSE 93 10/25/2016 1457   BUN 17 05/24/2017 1308   BUN 17.6 10/25/2016 1457   CREATININE 0.86 05/24/2017 1308   CREATININE 0.8 10/25/2016 1457   CALCIUM 9.6 05/24/2017 1308   CALCIUM 9.2 10/25/2016 1457   PROT 7.1 05/24/2017 1308   PROT 6.6 10/25/2016 1457   ALBUMIN 4.4 05/24/2017 1308   ALBUMIN 4.0 10/25/2016 1457   AST 26 05/24/2017 1308   AST 21  10/25/2016 1457   ALT 28 05/24/2017 1308   ALT 16 10/25/2016 1457   ALKPHOS 108 05/24/2017 1308   ALKPHOS 91 10/25/2016 1457   BILITOT 0.5 05/24/2017 1308   BILITOT 0.49 10/25/2016 1457   GFRNONAA >60 05/24/2017 1308   GFRAA >60 05/24/2017 1308    No results found for: SPEP, UPEP  Lab Results  Component Value Date   WBC 4.9 05/24/2017   NEUTROABS 2.8 05/24/2017   HGB 14.7 05/24/2017   HCT 43.3 05/24/2017   MCV 90.0 05/24/2017   PLT 269 05/24/2017      Chemistry      Component Value Date/Time   NA 139 05/24/2017 1308   NA 140 10/25/2016 1457   K 4.4 05/24/2017 1308   K 4.2 10/25/2016 1457   CL 104 05/24/2017 1308   CO2 30 (H) 05/24/2017 1308   CO2 26 10/25/2016 1457   BUN 17 05/24/2017 1308   BUN 17.6 10/25/2016 1457   CREATININE 0.86 05/24/2017 1308   CREATININE 0.8 10/25/2016  1457      Component Value Date/Time   CALCIUM 9.6 05/24/2017 1308   CALCIUM 9.2 10/25/2016 1457   ALKPHOS 108 05/24/2017 1308   ALKPHOS 91 10/25/2016 1457   AST 26 05/24/2017 1308   AST 21 10/25/2016 1457   ALT 28 05/24/2017 1308   ALT 16 10/25/2016 1457   BILITOT 0.5 05/24/2017 1308   BILITOT 0.49 10/25/2016 1457       RADIOGRAPHIC STUDIES: I have personally reviewed the radiological images as listed and agreed with the findings in the report. Ct Chest W Contrast  Result Date: 05/25/2017 CLINICAL DATA:  Lymphoma diagnosed 2017.  Chemotherapy complete. EXAM: CT CHEST, ABDOMEN, AND PELVIS WITH CONTRAST TECHNIQUE: Multidetector CT imaging of the chest, abdomen and pelvis was performed following the standard protocol during bolus administration of intravenous contrast. CONTRAST:  119m ISOVUE-300 IOPAMIDOL (ISOVUE-300) INJECTION 61% COMPARISON:  PET-CT 06/30/2015 FINDINGS: CT CHEST FINDINGS Cardiovascular: No significant vascular findings. Normal heart size. No pericardial effusion. Mediastinum/Nodes: No axillary supraclavicular adenopathy. No mediastinal hilar adenopathy. No pericardial  fluid. Esophagus normal. Lungs/Pleura: No suspicious pulmonary nodules. Mild linear scarring at the bases mild apical pleural thickening. Musculoskeletal: No aggressive osseous lesion. CT ABDOMEN AND PELVIS FINDINGS Hepatobiliary: No focal hepatic lesion. No biliary ductal dilatation. Gallbladder is normal. Common bile duct is normal. Pancreas: Pancreas is normal. No ductal dilatation. No pancreatic inflammation. Spleen: Normal spleen Adrenals/urinary tract: Adrenal glands normal. Nonenhancing cyst RIGHT kidney ureters and bladder normal. Stomach/Bowel: Stomach, small bowel, appendix, and cecum are normal. The colon and rectosigmoid colon are normal. Vascular/Lymphatic: Abdominal aorta is normal caliber. There is no retroperitoneal or periportal lymphadenopathy. No pelvic lymphadenopathy. Reproductive: Post hysterectomy anatomy Other: No free fluid. Musculoskeletal: No aggressive osseous lesion. IMPRESSION: Chest Impression: 1. No evidence of lymphoma recurrence. 2. Stable biapical pleural thickening. 3. Mild Coronary artery calcification and Aortic Atherosclerosis (ICD10-I70.0). Abdomen / Pelvis Impression: No lymphoma recurrence in the abdomen pelvis. Electronically Signed   By: SSuzy BouchardM.D.   On: 05/25/2017 10:10   Ct Abdomen Pelvis W Contrast  Result Date: 05/25/2017 CLINICAL DATA:  Lymphoma diagnosed 2017.  Chemotherapy complete. EXAM: CT CHEST, ABDOMEN, AND PELVIS WITH CONTRAST TECHNIQUE: Multidetector CT imaging of the chest, abdomen and pelvis was performed following the standard protocol during bolus administration of intravenous contrast. CONTRAST:  1045mISOVUE-300 IOPAMIDOL (ISOVUE-300) INJECTION 61% COMPARISON:  PET-CT 06/30/2015 FINDINGS: CT CHEST FINDINGS Cardiovascular: No significant vascular findings. Normal heart size. No pericardial effusion. Mediastinum/Nodes: No axillary supraclavicular adenopathy. No mediastinal hilar adenopathy. No pericardial fluid. Esophagus normal.  Lungs/Pleura: No suspicious pulmonary nodules. Mild linear scarring at the bases mild apical pleural thickening. Musculoskeletal: No aggressive osseous lesion. CT ABDOMEN AND PELVIS FINDINGS Hepatobiliary: No focal hepatic lesion. No biliary ductal dilatation. Gallbladder is normal. Common bile duct is normal. Pancreas: Pancreas is normal. No ductal dilatation. No pancreatic inflammation. Spleen: Normal spleen Adrenals/urinary tract: Adrenal glands normal. Nonenhancing cyst RIGHT kidney ureters and bladder normal. Stomach/Bowel: Stomach, small bowel, appendix, and cecum are normal. The colon and rectosigmoid colon are normal. Vascular/Lymphatic: Abdominal aorta is normal caliber. There is no retroperitoneal or periportal lymphadenopathy. No pelvic lymphadenopathy. Reproductive: Post hysterectomy anatomy Other: No free fluid. Musculoskeletal: No aggressive osseous lesion. IMPRESSION: Chest Impression: 1. No evidence of lymphoma recurrence. 2. Stable biapical pleural thickening. 3. Mild Coronary artery calcification and Aortic Atherosclerosis (ICD10-I70.0). Abdomen / Pelvis Impression: No lymphoma recurrence in the abdomen pelvis. Electronically Signed   By: StSuzy Bouchard.D.   On: 05/25/2017 10:10  All questions were answered. The patient knows to call the clinic with any problems, questions or concerns. No barriers to learning was detected.  I spent 10 minutes counseling the patient face to face. The total time spent in the appointment was 15 minutes and more than 50% was on counseling and review of test results  Heath Lark, MD 05/26/2017 8:24 AM

## 2017-07-24 DIAGNOSIS — H43813 Vitreous degeneration, bilateral: Secondary | ICD-10-CM | POA: Diagnosis not present

## 2017-07-24 DIAGNOSIS — H524 Presbyopia: Secondary | ICD-10-CM | POA: Diagnosis not present

## 2017-09-19 DIAGNOSIS — C44319 Basal cell carcinoma of skin of other parts of face: Secondary | ICD-10-CM | POA: Diagnosis not present

## 2017-09-19 DIAGNOSIS — L720 Epidermal cyst: Secondary | ICD-10-CM | POA: Diagnosis not present

## 2017-09-19 DIAGNOSIS — C44311 Basal cell carcinoma of skin of nose: Secondary | ICD-10-CM | POA: Diagnosis not present

## 2017-10-17 DIAGNOSIS — J309 Allergic rhinitis, unspecified: Secondary | ICD-10-CM | POA: Diagnosis not present

## 2017-10-17 DIAGNOSIS — M810 Age-related osteoporosis without current pathological fracture: Secondary | ICD-10-CM | POA: Diagnosis not present

## 2017-10-17 DIAGNOSIS — Z23 Encounter for immunization: Secondary | ICD-10-CM | POA: Diagnosis not present

## 2017-10-17 DIAGNOSIS — M159 Polyosteoarthritis, unspecified: Secondary | ICD-10-CM | POA: Diagnosis not present

## 2017-10-17 DIAGNOSIS — C8590 Non-Hodgkin lymphoma, unspecified, unspecified site: Secondary | ICD-10-CM | POA: Diagnosis not present

## 2017-10-17 DIAGNOSIS — Z9181 History of falling: Secondary | ICD-10-CM | POA: Diagnosis not present

## 2017-11-20 DIAGNOSIS — M25562 Pain in left knee: Secondary | ICD-10-CM | POA: Diagnosis not present

## 2017-11-20 DIAGNOSIS — J309 Allergic rhinitis, unspecified: Secondary | ICD-10-CM | POA: Diagnosis not present

## 2017-11-20 DIAGNOSIS — C8590 Non-Hodgkin lymphoma, unspecified, unspecified site: Secondary | ICD-10-CM | POA: Diagnosis not present

## 2017-11-20 DIAGNOSIS — M810 Age-related osteoporosis without current pathological fracture: Secondary | ICD-10-CM | POA: Diagnosis not present

## 2017-11-20 DIAGNOSIS — M159 Polyosteoarthritis, unspecified: Secondary | ICD-10-CM | POA: Diagnosis not present

## 2017-11-28 DIAGNOSIS — M1712 Unilateral primary osteoarthritis, left knee: Secondary | ICD-10-CM | POA: Diagnosis not present

## 2017-12-12 DIAGNOSIS — M1712 Unilateral primary osteoarthritis, left knee: Secondary | ICD-10-CM | POA: Diagnosis not present

## 2017-12-18 DIAGNOSIS — M2540 Effusion, unspecified joint: Secondary | ICD-10-CM | POA: Diagnosis not present

## 2017-12-18 DIAGNOSIS — S82145A Nondisplaced bicondylar fracture of left tibia, initial encounter for closed fracture: Secondary | ICD-10-CM | POA: Diagnosis not present

## 2017-12-18 DIAGNOSIS — M25462 Effusion, left knee: Secondary | ICD-10-CM | POA: Diagnosis not present

## 2017-12-28 DIAGNOSIS — M84362A Stress fracture, left tibia, initial encounter for fracture: Secondary | ICD-10-CM | POA: Diagnosis not present

## 2017-12-28 DIAGNOSIS — S83242A Other tear of medial meniscus, current injury, left knee, initial encounter: Secondary | ICD-10-CM | POA: Diagnosis not present

## 2017-12-29 DIAGNOSIS — Z1231 Encounter for screening mammogram for malignant neoplasm of breast: Secondary | ICD-10-CM | POA: Diagnosis not present

## 2018-01-01 DIAGNOSIS — J209 Acute bronchitis, unspecified: Secondary | ICD-10-CM | POA: Diagnosis not present

## 2018-01-03 IMAGING — CR DG CHEST 1V PORT
1 series · 1 of 1 positions shown · non-contrast
Comparison: 01/19/2015

CLINICAL DATA: Status post left port insertion

EXAM:
PORTABLE CHEST 1 VIEW

[AP]
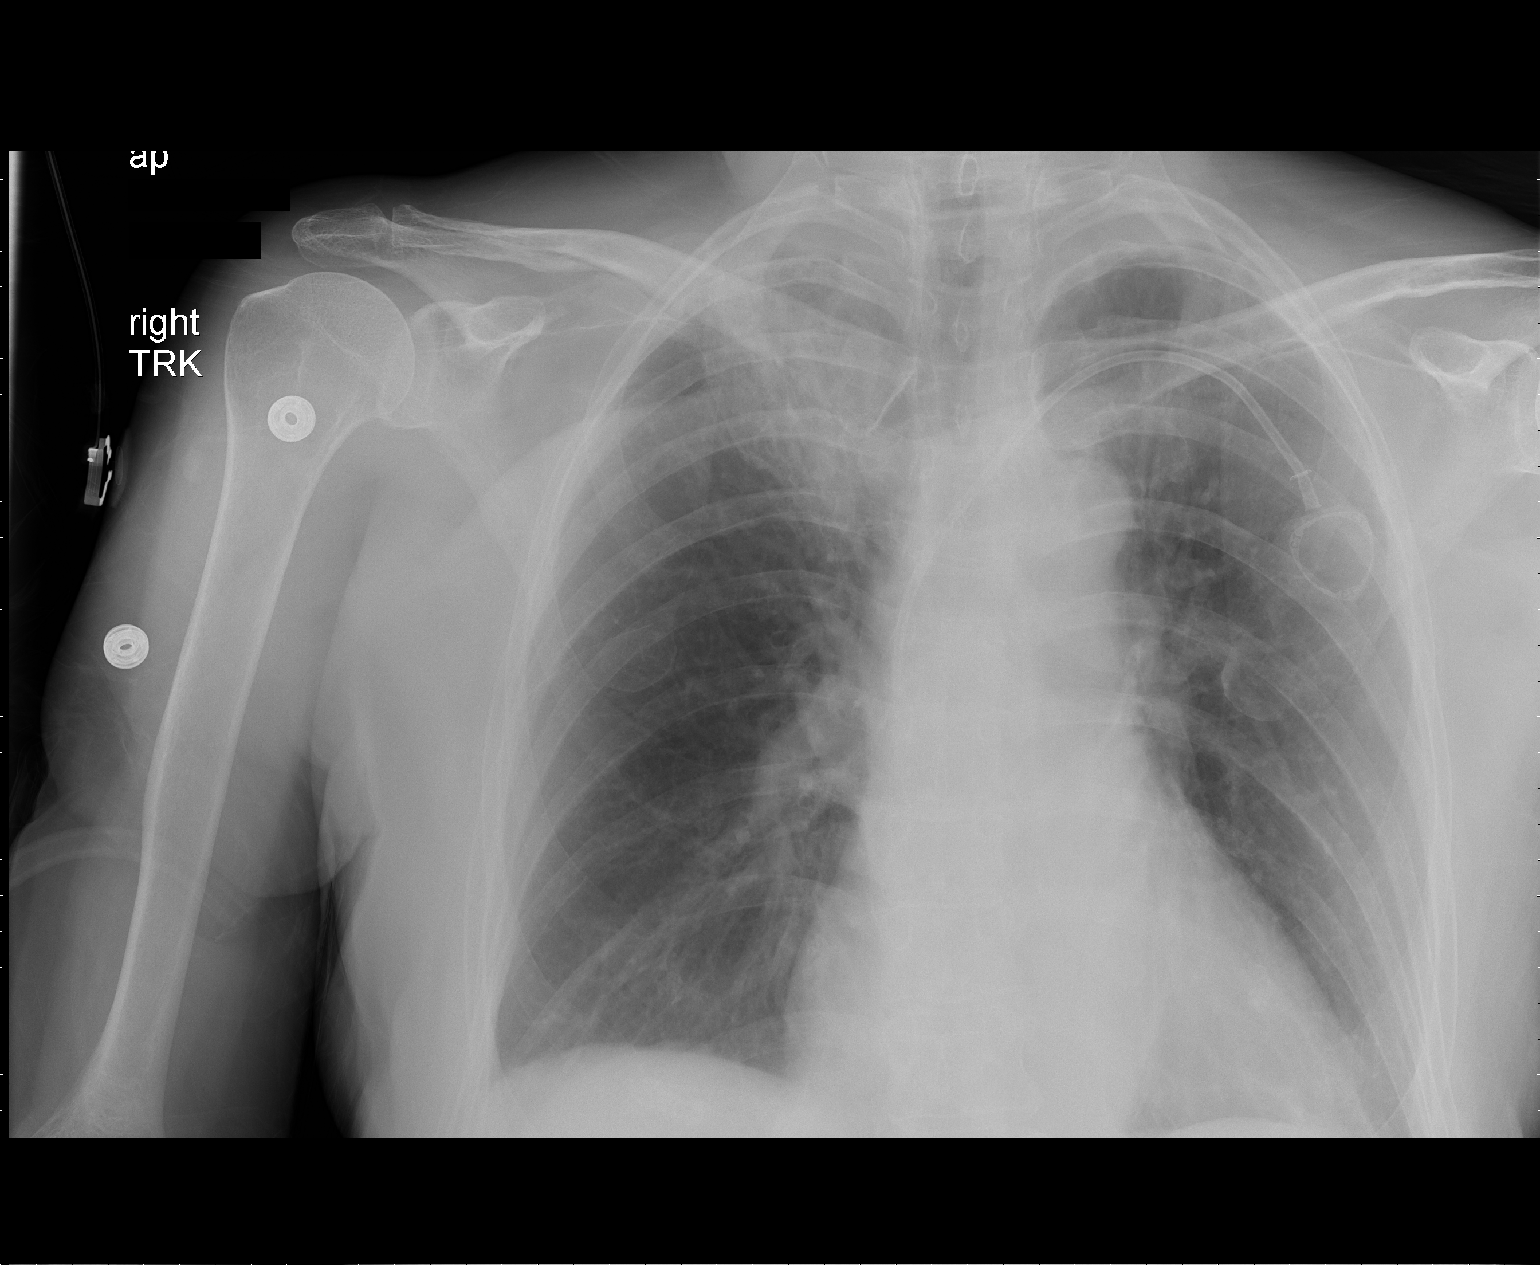

[1 of 1 positions shown; findings below may reference images not displayed]

FINDINGS: Cardiac shadow is stable. A new left chest wall port is noted with
the catheter tip in the mid superior vena cava. No pneumothorax is
seen. There is an area of increased density in the medial aspect of
the right lung apex. This may represent some atelectatic changes and
was not present on the recent PET-CT. No other focal abnormality is
noted.
IMPRESSION: No pneumothorax following port placement.

Changes suggestive of right upper lobe infiltrate medially.

## 2018-02-08 DIAGNOSIS — M5432 Sciatica, left side: Secondary | ICD-10-CM | POA: Diagnosis not present

## 2018-02-15 DIAGNOSIS — G5702 Lesion of sciatic nerve, left lower limb: Secondary | ICD-10-CM | POA: Diagnosis not present

## 2018-02-15 DIAGNOSIS — M5432 Sciatica, left side: Secondary | ICD-10-CM | POA: Diagnosis not present

## 2018-02-18 IMAGING — CT NM PET TUM IMG RESTAG (PS) SKULL BASE T - THIGH
8 of 9 series · 18 of 25 positions shown · non-contrast
Comparison: 01/19/2015

CLINICAL DATA: Subsequent treatment strategy for B- cell lymphoma.

EXAM:
NUCLEAR MEDICINE PET SKULL BASE TO THIGH
TECHNIQUE: 6.8 mCi F-18 FDG was injected intravenously. Full-ring PET imaging
was performed from the skull base to thigh after the radiotracer. CT
data was obtained and used for attenuation correction and anatomic
localization.
FASTING BLOOD GLUCOSE:  Value: 107 mg/dl

[Series 3: pet sk_thigh ac · axial · 5.0mm · 4.07mm/px · z∈[-788,+72]mm · 3 of 216 slices shown]
[im 1/216]
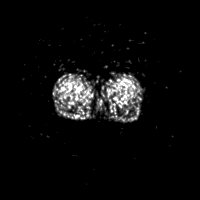
[im 144/216]
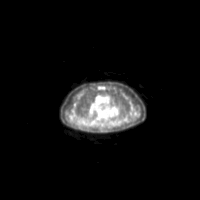
[im 216/216]
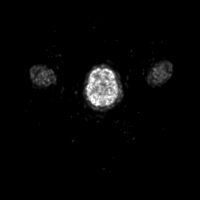

[Series 4: ct sk_thigh 5.0 b31f · axial · 5.0mm · 0.98mm/px · z∈[-788,+72]mm · 3 of 216 slices shown]
[im 1/216]
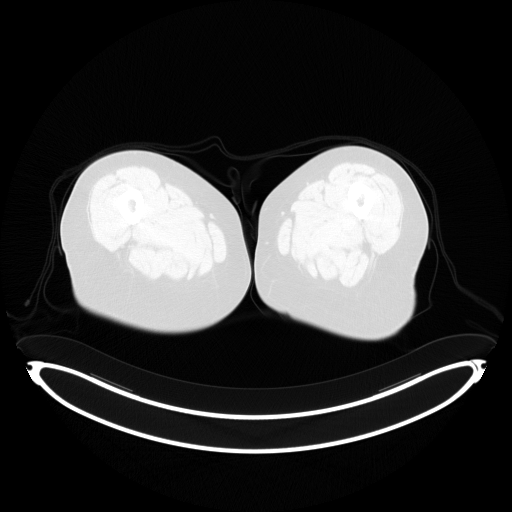
[im 144/216]
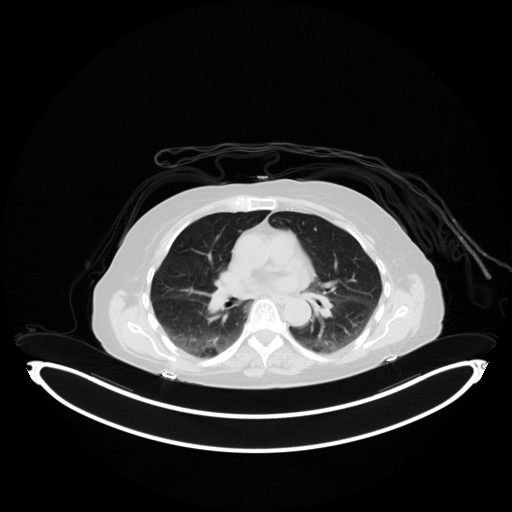
[im 216/216  brain]
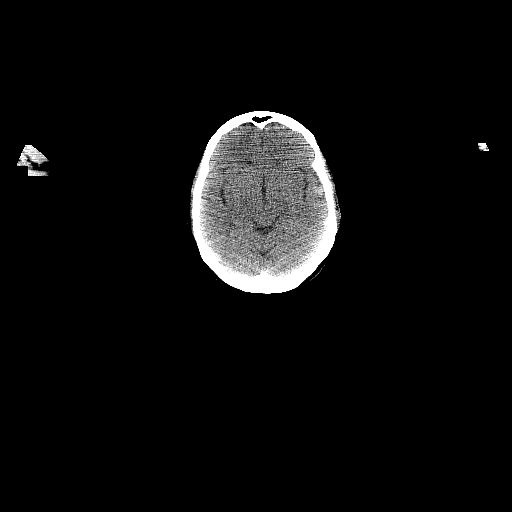

[Series 6: ct sk_thigh 5.0 b70f (id)_bone · axial · 5.0mm · 0.56mm/px · 1 of 73 slices shown]
[im 73/73  bone]
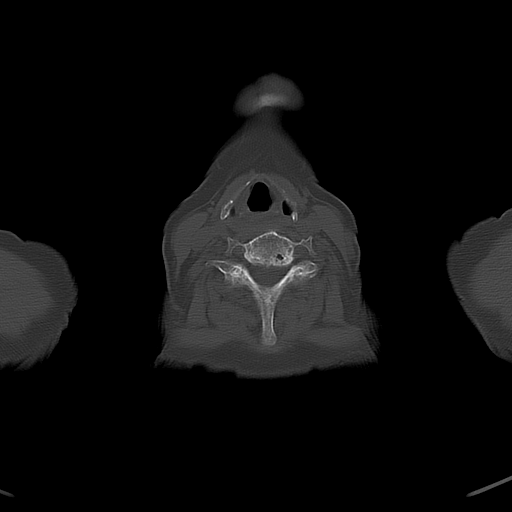

[Series 8: pet sk_thigh nac · axial · 5.0mm · 4.07mm/px · z∈[-788,+72]mm · 4 of 216 slices shown]
[im 1/216]
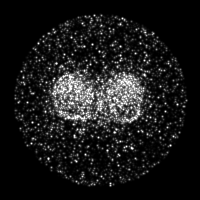
[im 54/216]
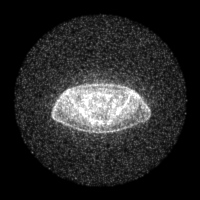
[im 162/216]
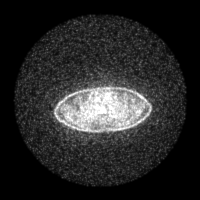
[im 216/216]
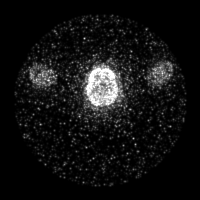

[Series 604: range-ct sk_thigh 5.0 (id)<alpha range> · 1 of 73 slices shown (1 of 2)]
[im 1/73]
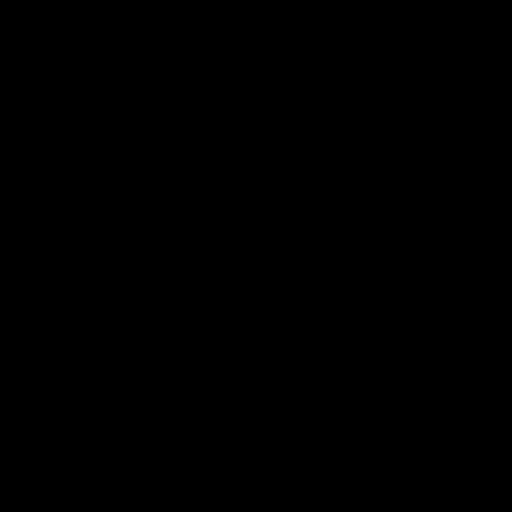

[Series 605: mip collection<mip range> · coronal · 1.79mm/px · 1 of 32 slices shown]
[im 1/32]
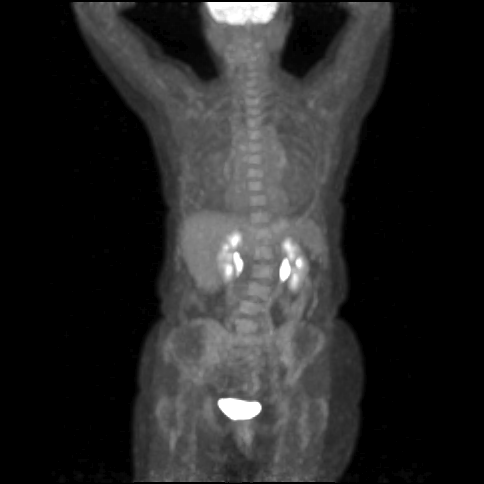

[Series 606: range-ct sk_thigh 5.0 (id)<alpha range> · 4 of 202 slices shown (2 of 2)]
[im 1/202]
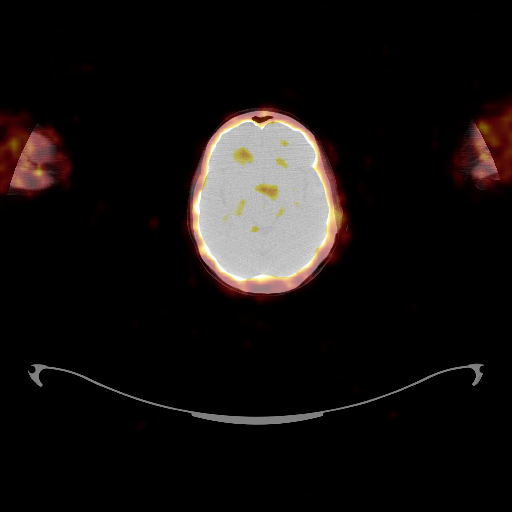
[im 101/202]
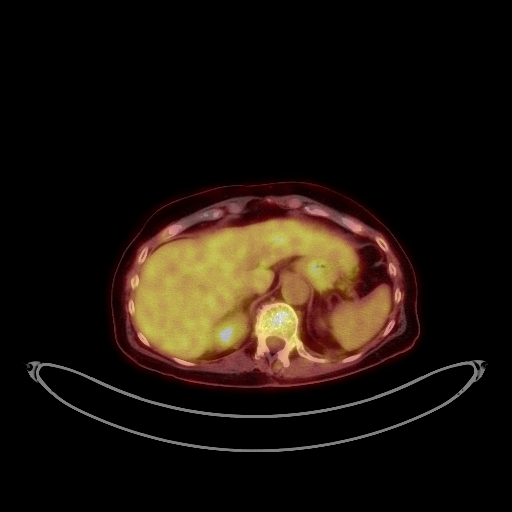
[im 151/202]
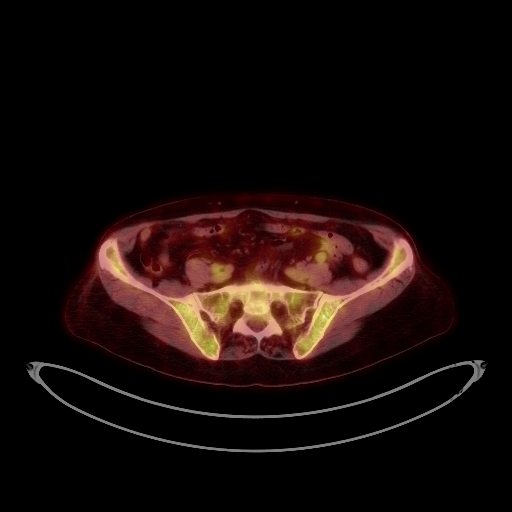
[im 202/202]
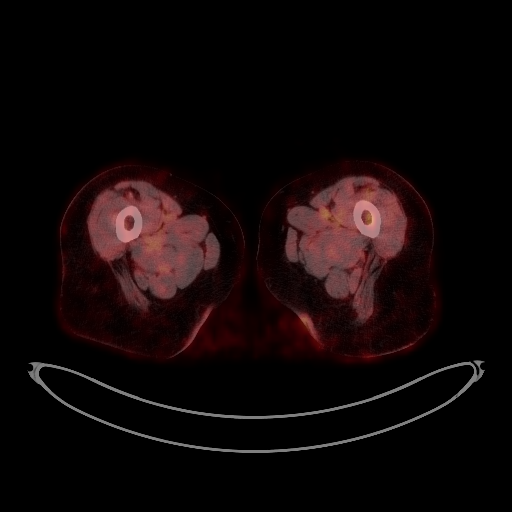

[Series 1121: results mm oncology reading · 0.48mm/px · 1 of 2 slices shown]
[im 1/2]
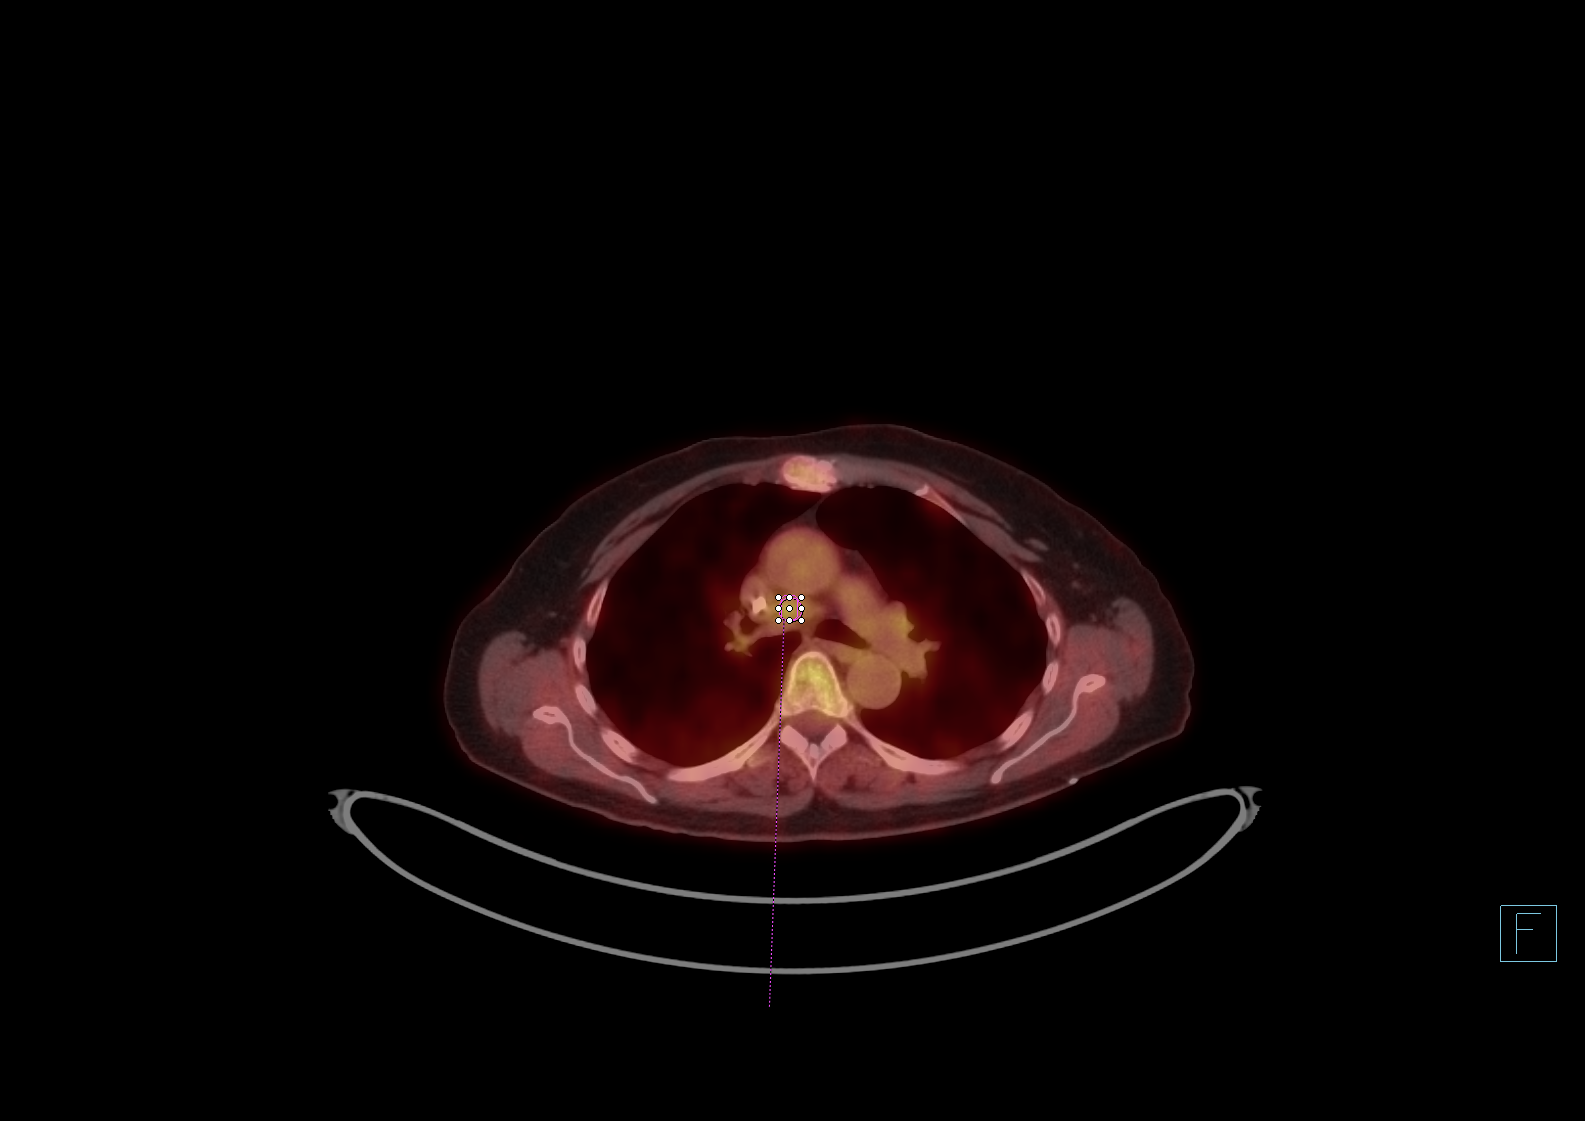

[18 of 25 positions shown; findings below may reference images not displayed]

FINDINGS: NECK

No hypermetabolic lymph nodes in the neck.

CHEST

Left axillary low-density collection reduced in size, currently
by 1.3 cm on image 57/4 and previously 3.3 by 2.2 cm, faint
metabolic activity with maximum SUV 2.1. Likely seroma.

Small precarinal lymph node measuring 1 cm in short axis on image 66
series 4 has maximum standard uptake value 4.7, previously 4.0.
Small right hilar lymph node has maximum standard uptake value 3.8,
previously 5.8. Left infrahilar node maximum standard uptake value
4.4, previously 3.5.

Background mediastinal blood pool activity maximum SUV is 3.5.

ABDOMEN/PELVIS

Background hepatic activity maximum SUV is 3.75.

Focal mildly increased uptake in the gastric cardia, maximum SUV
5.0, previously 6.0.

The focally hypermetabolic lymph node just below the aortic
bifurcation on the prior exam is no longer readily apparent. The
other focally hypermetabolic lymph node along the left posterior
margin of the abdominal aorta on the prior exam is likewise not
hypermetabolic today. No abnormal splenic activity.

SKELETON

Mildly generally accentuated skeletal activity probably reflecting
granulocyte stimulation. Levoconvex lumbar scoliosis with rotary
component.
IMPRESSION: 1. The abdominal nodal activity has resolved. However, there several
faintly hypermetabolic lymph nodes in the chest including a
precarinal lymph node with maximum standard uptake value
(previously 4.0) and a left infrahilar node with maximum SUV 4.4,
previously 3.5.
2. A small right hilar lymph node has decreased in activity from
prior measurement of 5.8 to current SUV max of 3.8.
3. There continues to be some increased activity in the gastric
cardia, currently SUV 5.0, previously 6.0. Most likely this is
physiologic activity rather than lymphomatous involvement, but the
area has been underdistended on scans making it hard to assess for
wall thickening.
4. Mildly accentuated generalized skeletal activity probably from
granulocyte stimulation.

## 2018-02-19 DIAGNOSIS — M5432 Sciatica, left side: Secondary | ICD-10-CM | POA: Diagnosis not present

## 2018-02-19 DIAGNOSIS — G5702 Lesion of sciatic nerve, left lower limb: Secondary | ICD-10-CM | POA: Diagnosis not present

## 2018-02-21 DIAGNOSIS — M5432 Sciatica, left side: Secondary | ICD-10-CM | POA: Diagnosis not present

## 2018-02-21 DIAGNOSIS — G5702 Lesion of sciatic nerve, left lower limb: Secondary | ICD-10-CM | POA: Diagnosis not present

## 2018-02-26 DIAGNOSIS — M5432 Sciatica, left side: Secondary | ICD-10-CM | POA: Diagnosis not present

## 2018-02-26 DIAGNOSIS — G5702 Lesion of sciatic nerve, left lower limb: Secondary | ICD-10-CM | POA: Diagnosis not present

## 2018-02-28 DIAGNOSIS — M5432 Sciatica, left side: Secondary | ICD-10-CM | POA: Diagnosis not present

## 2018-02-28 DIAGNOSIS — G5702 Lesion of sciatic nerve, left lower limb: Secondary | ICD-10-CM | POA: Diagnosis not present

## 2018-03-05 DIAGNOSIS — M5432 Sciatica, left side: Secondary | ICD-10-CM | POA: Diagnosis not present

## 2018-03-05 DIAGNOSIS — G5702 Lesion of sciatic nerve, left lower limb: Secondary | ICD-10-CM | POA: Diagnosis not present

## 2018-03-07 DIAGNOSIS — G5702 Lesion of sciatic nerve, left lower limb: Secondary | ICD-10-CM | POA: Diagnosis not present

## 2018-03-07 DIAGNOSIS — M5432 Sciatica, left side: Secondary | ICD-10-CM | POA: Diagnosis not present

## 2018-03-12 DIAGNOSIS — M5432 Sciatica, left side: Secondary | ICD-10-CM | POA: Diagnosis not present

## 2018-03-12 DIAGNOSIS — G5702 Lesion of sciatic nerve, left lower limb: Secondary | ICD-10-CM | POA: Diagnosis not present

## 2018-03-14 DIAGNOSIS — M5432 Sciatica, left side: Secondary | ICD-10-CM | POA: Diagnosis not present

## 2018-03-14 DIAGNOSIS — G5702 Lesion of sciatic nerve, left lower limb: Secondary | ICD-10-CM | POA: Diagnosis not present

## 2018-04-25 ENCOUNTER — Encounter: Payer: Self-pay | Admitting: Gastroenterology

## 2018-05-24 ENCOUNTER — Ambulatory Visit: Payer: Self-pay | Admitting: Hematology and Oncology

## 2018-05-24 ENCOUNTER — Other Ambulatory Visit: Payer: Self-pay

## 2018-06-26 ENCOUNTER — Encounter: Payer: Self-pay | Admitting: Hematology and Oncology

## 2018-06-26 ENCOUNTER — Other Ambulatory Visit: Payer: Self-pay

## 2018-06-26 ENCOUNTER — Inpatient Hospital Stay (HOSPITAL_BASED_OUTPATIENT_CLINIC_OR_DEPARTMENT_OTHER): Payer: Medicare Other | Admitting: Hematology and Oncology

## 2018-06-26 ENCOUNTER — Inpatient Hospital Stay: Payer: Medicare Other | Attending: Hematology and Oncology

## 2018-06-26 ENCOUNTER — Other Ambulatory Visit: Payer: Self-pay | Admitting: Hematology and Oncology

## 2018-06-26 DIAGNOSIS — Z85828 Personal history of other malignant neoplasm of skin: Secondary | ICD-10-CM | POA: Diagnosis not present

## 2018-06-26 DIAGNOSIS — Z8579 Personal history of other malignant neoplasms of lymphoid, hematopoietic and related tissues: Secondary | ICD-10-CM

## 2018-06-26 DIAGNOSIS — C8338 Diffuse large B-cell lymphoma, lymph nodes of multiple sites: Secondary | ICD-10-CM | POA: Insufficient documentation

## 2018-06-26 DIAGNOSIS — Z299 Encounter for prophylactic measures, unspecified: Secondary | ICD-10-CM | POA: Diagnosis not present

## 2018-06-26 LAB — CBC WITH DIFFERENTIAL/PLATELET
Abs Immature Granulocytes: 0.02 10*3/uL (ref 0.00–0.07)
Basophils Absolute: 0 10*3/uL (ref 0.0–0.1)
Basophils Relative: 1 %
Eosinophils Absolute: 0.1 10*3/uL (ref 0.0–0.5)
Eosinophils Relative: 2 %
HCT: 45.8 % (ref 36.0–46.0)
Hemoglobin: 14.8 g/dL (ref 12.0–15.0)
Immature Granulocytes: 0 %
Lymphocytes Relative: 27 %
Lymphs Abs: 1.5 10*3/uL (ref 0.7–4.0)
MCH: 29.2 pg (ref 26.0–34.0)
MCHC: 32.3 g/dL (ref 30.0–36.0)
MCV: 90.3 fL (ref 80.0–100.0)
Monocytes Absolute: 0.8 10*3/uL (ref 0.1–1.0)
Monocytes Relative: 14 %
Neutro Abs: 3 10*3/uL (ref 1.7–7.7)
Neutrophils Relative %: 56 %
Platelets: 270 10*3/uL (ref 150–400)
RBC: 5.07 MIL/uL (ref 3.87–5.11)
RDW: 13 % (ref 11.5–15.5)
WBC: 5.3 10*3/uL (ref 4.0–10.5)
nRBC: 0 % (ref 0.0–0.2)

## 2018-06-26 NOTE — Assessment & Plan Note (Signed)
She had history of skin cancer We discussed the importance of avoiding excessive sun exposure and wearing sunscreen She will close follow-up with her dermatologist

## 2018-06-26 NOTE — Assessment & Plan Note (Signed)
Clinically, she had no signs of cancer recurrence Her exam is benign I recommend close follow-up with primary care doctor and to continue age-appropriate screening modalities I reinforced the importance of annual influenza vaccination The patient is educated to watch out for signs and symptoms of cancer recurrence I plan to see her back once a year with history, physical examination and blood work only and to defer imaging studies unless indication arise

## 2018-06-26 NOTE — Progress Notes (Signed)
[place] OFFICE PROGRESS NOTE  Patient Care Team: Cher Nakai, MD as PCP - General (Internal Medicine) Coralie Keens, MD as Consulting Physician (General Surgery)  ASSESSMENT & PLAN:  History of lymphoma Clinically, she had no signs of cancer recurrence Her exam is benign I recommend close follow-up with primary care doctor and to continue age-appropriate screening modalities I reinforced the importance of annual influenza vaccination The patient is educated to watch out for signs and symptoms of cancer recurrence I plan to see her back once a year with history, physical examination and blood work only and to defer imaging studies unless indication arise  Hx of skin cancer, basal cell She had history of skin cancer We discussed the importance of avoiding excessive sun exposure and wearing sunscreen She will close follow-up with her dermatologist  Preventive measure Even though she is 3 years out from treatment, I continue to reinforce the importance of avoidance of exposure and continue to practice social distancing   No orders of the defined types were placed in this encounter.   INTERVAL HISTORY: Please see below for problem oriented charting. She returns for follow-up She denies new lymphadenopathy No recent infection She has lost some weight since I last saw her but she attributes that to healthy dietary choices  SUMMARY OF ONCOLOGIC HISTORY: Oncology History  History of lymphoma  01/07/2015 Procedure   She had excisional biopsy of left axilla   01/07/2015 Pathology Results   Accession: [id number] showed follicular and DLBCL   09/10/4780 Imaging   PET scan showed stage III disease   01/20/2015 Bone Marrow Biopsy   BM biopsy showed normal cytogenetics with possible involvement of low grade lymphoma.  Accession: [id number]   01/22/2015 Imaging   ECHo showed normal EF   01/27/2015 - 05/13/2015 Chemotherapy   She received R-CHOP chemo x 6   02/12/2015  Procedure   She had port placement   03/30/2015 Imaging   PET CT scan showed positive response to treatment   06/30/2015 PET scan   Mildly hypermetabolic right lower paratracheal lymph node, which has slightly decreased in size and metabolism.   09/28/2015 Imaging   Ct chest showed low right paratracheal lymph node is stable in size. No additional evidence of recurrent lymphoma   06/28/2016 Imaging   1. Stable precarinal and left hilar lymph nodes when compared to prior study. No new adenopathy in the mediastinum or hilar regions. 2. No axillary or supraclavicular adenopathy. No upper abdominal adenopathy. 3. No significant pulmonary findings. 4. Aortic Atherosclerosis (ICD10-I70.0).   05/24/2017 Imaging   Chest Impression:  1. No evidence of lymphoma recurrence. 2. Stable biapical pleural thickening. 3. Mild Coronary artery calcification and Aortic Atherosclerosis (ICD10-I70.0).  Abdomen / Pelvis Impression:  No lymphoma recurrence in the abdomen pelvis.     REVIEW OF SYSTEMS:   Constitutional: Denies fevers, chills or abnormal weight loss Eyes: Denies blurriness of vision Ears, nose, mouth, throat, and face: Denies mucositis or sore throat Respiratory: Denies cough, dyspnea or wheezes Cardiovascular: Denies palpitation, chest discomfort or lower extremity swelling Gastrointestinal:  Denies nausea, heartburn or change in bowel habits Skin: Denies abnormal skin rashes Lymphatics: Denies new lymphadenopathy or easy bruising Neurological:Denies numbness, tingling or new weaknesses Behavioral/Psych: Mood is stable, no new changes  All other systems were reviewed with the patient and are negative.  I have reviewed the past medical history, past surgical history, social history and family history with the patient and they are unchanged from previous note.  ALLERGIES:  has No Known Allergies.  MEDICATIONS:  Current Outpatient Medications  Medication Sig Dispense Refill  .  calcium carbonate (OSCAL) 1500 (600 CA) MG TABS tablet Take by mouth 2 (two) times daily with a meal.    . cetirizine (ZYRTEC) 10 MG tablet Take 10 mg by mouth daily.    . Cholecalciferol (VITAMIN D3 SUPER STRENGTH) 2000 units CAPS Take by mouth.    . Omega-3 Fatty Acids (FISH OIL) 1200 MG CAPS Take by mouth.     No current facility-administered medications for this visit.     PHYSICAL EXAMINATION: ECOG PERFORMANCE STATUS: 0 - Asymptomatic  Vitals:   06/26/18 1338  BP: 121/78  Pulse: 88  Resp: 18  Temp: 98 F (36.7 C)   Filed Weights   06/26/18 1338  Weight: 130 lb 3.2 oz (59.1 kg)    GENERAL:alert, no distress and comfortable SKIN: skin color, texture, turgor are normal, no rashes or significant lesions EYES: normal, Conjunctiva are pink and non-injected, sclera clear OROPHARYNX:no exudate, no erythema and lips, buccal mucosa, and tongue normal  NECK: supple, thyroid normal size, non-tender, without nodularity LYMPH:  no palpable lymphadenopathy in the cervical, axillary or inguinal LUNGS: clear to auscultation and percussion with normal breathing effort HEART: regular rate & rhythm and no murmurs and no lower extremity edema ABDOMEN:abdomen soft, non-tender and normal bowel sounds Musculoskeletal:no cyanosis of digits and no clubbing  NEURO: alert & oriented x 3 with fluent speech, no focal motor/sensory deficits  LABORATORY DATA:  I have reviewed the data as listed    Component Value Date/Time   NA 139 05/24/2017 1308   NA 140 10/25/2016 1457   K 4.4 05/24/2017 1308   K 4.2 10/25/2016 1457   CL 104 05/24/2017 1308   CO2 30 (H) 05/24/2017 1308   CO2 26 10/25/2016 1457   GLUCOSE 99 05/24/2017 1308   GLUCOSE 93 10/25/2016 1457   BUN 17 05/24/2017 1308   BUN 17.6 10/25/2016 1457   CREATININE 0.86 05/24/2017 1308   CREATININE 0.8 10/25/2016 1457   CALCIUM 9.6 05/24/2017 1308   CALCIUM 9.2 10/25/2016 1457   PROT 7.1 05/24/2017 1308   PROT 6.6 10/25/2016 1457    ALBUMIN 4.4 05/24/2017 1308   ALBUMIN 4.0 10/25/2016 1457   AST 26 05/24/2017 1308   AST 21 10/25/2016 1457   ALT 28 05/24/2017 1308   ALT 16 10/25/2016 1457   ALKPHOS 108 05/24/2017 1308   ALKPHOS 91 10/25/2016 1457   BILITOT 0.5 05/24/2017 1308   BILITOT 0.49 10/25/2016 1457   GFRNONAA >60 05/24/2017 1308   GFRAA >60 05/24/2017 1308    No results found for: SPEP, UPEP  Lab Results  Component Value Date   WBC 5.3 06/26/2018   NEUTROABS 3.0 06/26/2018   HGB 14.8 06/26/2018   HCT 45.8 06/26/2018   MCV 90.3 06/26/2018   PLT 270 06/26/2018      Chemistry      Component Value Date/Time   NA 139 05/24/2017 1308   NA 140 10/25/2016 1457   K 4.4 05/24/2017 1308   K 4.2 10/25/2016 1457   CL 104 05/24/2017 1308   CO2 30 (H) 05/24/2017 1308   CO2 26 10/25/2016 1457   BUN 17 05/24/2017 1308   BUN 17.6 10/25/2016 1457   CREATININE 0.86 05/24/2017 1308   CREATININE 0.8 10/25/2016 1457      Component Value Date/Time   CALCIUM 9.6 05/24/2017 1308   CALCIUM 9.2 10/25/2016 1457   ALKPHOS 108 05/24/2017 1308  ALKPHOS 91 10/25/2016 1457   AST 26 05/24/2017 1308   AST 21 10/25/2016 1457   ALT 28 05/24/2017 1308   ALT 16 10/25/2016 1457   BILITOT 0.5 05/24/2017 1308   BILITOT 0.49 10/25/2016 1457      All questions were answered. The patient knows to call the clinic with any problems, questions or concerns. No barriers to learning was detected.  I spent 10 minutes counseling the patient face to face. The total time spent in the appointment was 15 minutes and more than 50% was on counseling and review of test results  Heath Lark, MD 06/26/2018 2:16 PM

## 2018-06-26 NOTE — Assessment & Plan Note (Signed)
Even though she is 3 years out from treatment, I continue to reinforce the importance of avoidance of exposure and continue to practice social distancing

## 2018-09-11 DIAGNOSIS — Z9181 History of falling: Secondary | ICD-10-CM | POA: Diagnosis not present

## 2018-09-11 DIAGNOSIS — E785 Hyperlipidemia, unspecified: Secondary | ICD-10-CM | POA: Diagnosis not present

## 2018-09-11 DIAGNOSIS — Z0000 Encounter for general adult medical examination without abnormal findings: Secondary | ICD-10-CM | POA: Diagnosis not present

## 2018-10-18 DIAGNOSIS — Z23 Encounter for immunization: Secondary | ICD-10-CM | POA: Diagnosis not present

## 2018-10-18 DIAGNOSIS — M159 Polyosteoarthritis, unspecified: Secondary | ICD-10-CM | POA: Diagnosis not present

## 2018-10-18 DIAGNOSIS — M810 Age-related osteoporosis without current pathological fracture: Secondary | ICD-10-CM | POA: Diagnosis not present

## 2018-10-18 DIAGNOSIS — Z9181 History of falling: Secondary | ICD-10-CM | POA: Diagnosis not present

## 2018-10-18 DIAGNOSIS — C8590 Non-Hodgkin lymphoma, unspecified, unspecified site: Secondary | ICD-10-CM | POA: Diagnosis not present

## 2018-10-18 DIAGNOSIS — J309 Allergic rhinitis, unspecified: Secondary | ICD-10-CM | POA: Diagnosis not present

## 2018-12-03 DIAGNOSIS — H25813 Combined forms of age-related cataract, bilateral: Secondary | ICD-10-CM | POA: Diagnosis not present

## 2019-01-01 DIAGNOSIS — Z1231 Encounter for screening mammogram for malignant neoplasm of breast: Secondary | ICD-10-CM | POA: Diagnosis not present

## 2019-01-21 ENCOUNTER — Telehealth: Payer: Self-pay

## 2019-01-21 NOTE — Telephone Encounter (Signed)
She called and left a message requesting a handicap form for the DM[employer]. Would it be okay to get the COVID vaccine?

## 2019-01-22 NOTE — Telephone Encounter (Signed)
Called back and instructed per Dr. Alvy Bimler, to contact PCP for disability placard. She is okay to get COVID vaccine. She verbalized understanding.

## 2019-06-26 ENCOUNTER — Other Ambulatory Visit: Payer: Self-pay | Admitting: *Deleted

## 2019-06-26 DIAGNOSIS — T451X5A Adverse effect of antineoplastic and immunosuppressive drugs, initial encounter: Secondary | ICD-10-CM

## 2019-06-27 ENCOUNTER — Inpatient Hospital Stay: Payer: Medicare Other | Attending: Hematology and Oncology | Admitting: Hematology and Oncology

## 2019-06-27 ENCOUNTER — Telehealth: Payer: Self-pay | Admitting: Hematology and Oncology

## 2019-06-27 ENCOUNTER — Encounter: Payer: Self-pay | Admitting: Hematology and Oncology

## 2019-06-27 ENCOUNTER — Inpatient Hospital Stay: Payer: Medicare Other

## 2019-06-27 ENCOUNTER — Other Ambulatory Visit: Payer: Self-pay

## 2019-06-27 DIAGNOSIS — Z8579 Personal history of other malignant neoplasms of lymphoid, hematopoietic and related tissues: Secondary | ICD-10-CM

## 2019-06-27 DIAGNOSIS — Z8572 Personal history of non-Hodgkin lymphomas: Secondary | ICD-10-CM | POA: Diagnosis not present

## 2019-06-27 DIAGNOSIS — T451X5A Adverse effect of antineoplastic and immunosuppressive drugs, initial encounter: Secondary | ICD-10-CM

## 2019-06-27 LAB — CMP (CANCER CENTER ONLY)
ALT: 17 U/L (ref 0–44)
AST: 22 U/L (ref 15–41)
Albumin: 4.1 g/dL (ref 3.5–5.0)
Alkaline Phosphatase: 100 U/L (ref 38–126)
Anion gap: 10 (ref 5–15)
BUN: 19 mg/dL (ref 8–23)
CO2: 27 mmol/L (ref 22–32)
Calcium: 9.4 mg/dL (ref 8.9–10.3)
Chloride: 105 mmol/L (ref 98–111)
Creatinine: 0.84 mg/dL (ref 0.44–1.00)
GFR, Est AFR Am: >60 mL/min (ref >60)
GFR, Estimated: >60 mL/min (ref >60)
Glucose, Bld: 103 mg/dL — ABNORMAL HIGH (ref 70–99)
Potassium: 4.6 mmol/L (ref 3.5–5.1)
Sodium: 142 mmol/L (ref 135–145)
Total Bilirubin: 0.7 mg/dL (ref 0.3–1.2)
Total Protein: 6.7 g/dL (ref 6.5–8.1)

## 2019-06-27 LAB — CBC WITH DIFFERENTIAL (CANCER CENTER ONLY)
Abs Immature Granulocytes: 0.02 10*3/uL (ref 0.00–0.07)
Basophils Absolute: 0 10*3/uL (ref 0.0–0.1)
Basophils Relative: 1 %
Eosinophils Absolute: 0.1 10*3/uL (ref 0.0–0.5)
Eosinophils Relative: 2 %
HCT: 43.1 % (ref 36.0–46.0)
Hemoglobin: 14.3 g/dL (ref 12.0–15.0)
Immature Granulocytes: 0 %
Lymphocytes Relative: 29 %
Lymphs Abs: 1.5 10*3/uL (ref 0.7–4.0)
MCH: 30.2 pg (ref 26.0–34.0)
MCHC: 33.2 g/dL (ref 30.0–36.0)
MCV: 90.9 fL (ref 80.0–100.0)
Monocytes Absolute: 0.6 10*3/uL (ref 0.1–1.0)
Monocytes Relative: 12 %
Neutro Abs: 2.8 10*3/uL (ref 1.7–7.7)
Neutrophils Relative %: 56 %
Platelet Count: 265 10*3/uL (ref 150–400)
RBC: 4.74 MIL/uL (ref 3.87–5.11)
RDW: 12.9 % (ref 11.5–15.5)
WBC Count: 5 10*3/uL (ref 4.0–10.5)
nRBC: 0 % (ref 0.0–0.2)

## 2019-06-27 NOTE — Telephone Encounter (Signed)
Scheduled per 6/17 sch message. Mailing appt calendar to pt.

## 2019-06-27 NOTE — Progress Notes (Signed)
[facility] OFF[facility] PROGRESS NOTE  Patient Care Team: Cher Nakai, MD as PCP - General (Internal Medicine) Coralie Keens, MD as Consulting Physician (General Surgery)  ASSESSMENT & PLAN:  History of lymphoma Clinically, she had no signs of cancer recurrence Her exam is benign I recommend close follow-up with primary care doctor and to continue age-appropriate screening modalities The patient is educated to watch out for signs and symptoms of cancer recurrence I plan to see her back once a year with history, physical examination and blood work only and to defer imaging studies unless indication arise   Orders Placed This Encounter  Procedures  . CBC with Differential/Platelet    Standing Status:   Standing    Number of Occurrences:   22    Standing Expiration Date:   06/26/2020  . Comprehensive metabolic panel    Standing Status:   Standing    Number of Occurrences:   33    Standing Expiration Date:   06/26/2020    All questions were answered. The patient knows to call the clinic with any problems, questions or concerns. The total time spent in the appointment was 15 minutes encounter with patients including review of chart and various tests results, discussions about plan of care and coordination of care plan   Heath Lark, MD 06/27/2019 1:52 PM  INTERVAL HISTORY: Please see below for problem oriented charting. She returns for lymphoma follow-up She is doing well No recent infection, fever or chills Appetite is stable She feels healthy and exercise on a regular basis No new lymphadenopathy  SUMMARY OF ONCOLOGIC HISTORY: Oncology History  History of lymphoma  01/07/2015 Procedure   She had excisional biopsy of left axilla   01/07/2015 Pathology Results   Accession: [id number] showed follicular and DLBCL   04/11/6832 Imaging   PET scan showed stage III disease   01/20/2015 Bone Marrow Biopsy   BM biopsy showed normal cytogenetics with possible  involvement of low grade lymphoma.  Accession: [id number]   01/22/2015 Imaging   ECHo showed normal EF   01/27/2015 - 05/13/2015 Chemotherapy   She received R-CHOP chemo x 6   02/12/2015 Procedure   She had port placement   03/30/2015 Imaging   PET CT scan showed positive response to treatment   06/30/2015 PET scan   Mildly hypermetabolic right lower paratracheal lymph node, which has slightly decreased in size and metabolism.   09/28/2015 Imaging   Ct chest showed low right paratracheal lymph node is stable in size. No additional evidence of recurrent lymphoma   06/28/2016 Imaging   1. Stable precarinal and left hilar lymph nodes when compared to prior study. No new adenopathy in the mediastinum or hilar regions. 2. No axillary or supraclavicular adenopathy. No upper abdominal adenopathy. 3. No significant pulmonary findings. 4. Aortic Atherosclerosis (ICD10-I70.0).   05/24/2017 Imaging   Chest Impression:  1. No evidence of lymphoma recurrence. 2. Stable biapical pleural thickening. 3. Mild Coronary artery calcification and Aortic Atherosclerosis (ICD10-I70.0).  Abdomen / Pelvis Impression:  No lymphoma recurrence in the abdomen pelvis.     REVIEW OF SYSTEMS:   Constitutional: Denies fevers, chills or abnormal weight loss Eyes: Denies blurriness of vision Ears, nose, mouth, throat, and face: Denies mucositis or sore throat Respiratory: Denies cough, dyspnea or wheezes Cardiovascular: Denies palpitation, chest discomfort or lower extremity swelling Gastrointestinal:  Denies nausea, heartburn or change in bowel habits Skin: Denies abnormal skin rashes Lymphatics: Denies new lymphadenopathy or easy bruising Neurological:Denies numbness, tingling or new  weaknesses Behavioral/Psych: Mood is stable, no new changes  All other systems were reviewed with the patient and are negative.  I have reviewed the past medical history, past surgical history, social history and family history  with the patient and they are unchanged from previous note.  ALLERGIES:  has No Known Allergies.  MEDICATIONS:  Current Outpatient Medications  Medication Sig Dispense Refill  . calcium carbonate (OSCAL) 1500 (600 CA) MG TABS tablet Take by mouth 2 (two) times daily with a meal.    . cetirizine (ZYRTEC) 10 MG tablet Take 10 mg by mouth daily.    . Cholecalciferol (VITAMIN D3 SUPER STRENGTH) 2000 units CAPS Take by mouth.    . Omega-3 Fatty Acids (FISH OIL) 1200 MG CAPS Take by mouth.     No current facility-administered medications for this visit.    PHYSICAL EXAMINATION: ECOG PERFORMANCE STATUS: 0 - Asymptomatic  Vitals:   06/27/19 1323  BP: 113/66  Pulse: 86  Resp: 18  Temp: (!) 97.5 F (36.4 C)  SpO2: 97%   Filed Weights   06/27/19 1323  Weight: 128 lb (58.1 kg)    GENERAL:alert, no distress and comfortable SKIN: skin color, texture, turgor are normal, no rashes or significant lesions EYES: normal, Conjunctiva are pink and non-injected, sclera clear OROPHARYNX:no exudate, no erythema and lips, buccal mucosa, and tongue normal  NECK: supple, thyroid normal size, non-tender, without nodularity LYMPH:  no palpable lymphadenopathy in the cervical, axillary or inguinal LUNGS: clear to auscultation and percussion with normal breathing effort HEART: regular rate & rhythm and no murmurs and no lower extremity edema ABDOMEN:abdomen soft, non-tender and normal bowel sounds Musculoskeletal:no cyanosis of digits and no clubbing  NEURO: alert & oriented x 3 with fluent speech, no focal motor/sensory deficits  LABORATORY DATA:  I have reviewed the data as listed    Component Value Date/Time   NA 142 06/27/2019 1239   NA 140 10/25/2016 1457   K 4.6 06/27/2019 1239   K 4.2 10/25/2016 1457   CL 105 06/27/2019 1239   CO2 27 06/27/2019 1239   CO2 26 10/25/2016 1457   GLUCOSE 103 (H) 06/27/2019 1239   GLUCOSE 93 10/25/2016 1457   BUN 19 06/27/2019 1239   BUN 17.6 10/25/2016  1457   CREATININE 0.84 06/27/2019 1239   CREATININE 0.8 10/25/2016 1457   CALCIUM 9.4 06/27/2019 1239   CALCIUM 9.2 10/25/2016 1457   PROT 6.7 06/27/2019 1239   PROT 6.6 10/25/2016 1457   ALBUMIN 4.1 06/27/2019 1239   ALBUMIN 4.0 10/25/2016 1457   AST 22 06/27/2019 1239   AST 21 10/25/2016 1457   ALT 17 06/27/2019 1239   ALT 16 10/25/2016 1457   ALKPHOS 100 06/27/2019 1239   ALKPHOS 91 10/25/2016 1457   BILITOT 0.7 06/27/2019 1239   BILITOT 0.49 10/25/2016 1457   GFRNONAA >60 06/27/2019 1239   GFRAA >60 06/27/2019 1239    No results found for: SPEP, UPEP  Lab Results  Component Value Date   WBC 5.0 06/27/2019   NEUTROABS 2.8 06/27/2019   HGB 14.3 06/27/2019   HCT 43.1 06/27/2019   MCV 90.9 06/27/2019   PLT 265 06/27/2019      Chemistry      Component Value Date/Time   NA 142 06/27/2019 1239   NA 140 10/25/2016 1457   K 4.6 06/27/2019 1239   K 4.2 10/25/2016 1457   CL 105 06/27/2019 1239   CO2 27 06/27/2019 1239   CO2 26 10/25/2016 1457  BUN 19 06/27/2019 1239   BUN 17.6 10/25/2016 1457   CREATININE 0.84 06/27/2019 1239   CREATININE 0.8 10/25/2016 1457      Component Value Date/Time   CALCIUM 9.4 06/27/2019 1239   CALCIUM 9.2 10/25/2016 1457   ALKPHOS 100 06/27/2019 1239   ALKPHOS 91 10/25/2016 1457   AST 22 06/27/2019 1239   AST 21 10/25/2016 1457   ALT 17 06/27/2019 1239   ALT 16 10/25/2016 1457   BILITOT 0.7 06/27/2019 1239   BILITOT 0.49 10/25/2016 1457

## 2019-06-27 NOTE — Assessment & Plan Note (Signed)
Clinically, she had no signs of cancer recurrence Her exam is benign I recommend close follow-up with primary care doctor and to continue age-appropriate screening modalities The patient is educated to watch out for signs and symptoms of cancer recurrence I plan to see her back once a year with history, physical examination and blood work only and to defer imaging studies unless indication arise

## 2019-09-13 DIAGNOSIS — Z9181 History of falling: Secondary | ICD-10-CM | POA: Diagnosis not present

## 2019-09-13 DIAGNOSIS — Z139 Encounter for screening, unspecified: Secondary | ICD-10-CM | POA: Diagnosis not present

## 2019-09-13 DIAGNOSIS — Z0000 Encounter for general adult medical examination without abnormal findings: Secondary | ICD-10-CM | POA: Diagnosis not present

## 2019-09-13 DIAGNOSIS — Z23 Encounter for immunization: Secondary | ICD-10-CM | POA: Diagnosis not present

## 2019-10-21 DIAGNOSIS — J309 Allergic rhinitis, unspecified: Secondary | ICD-10-CM | POA: Diagnosis not present

## 2019-10-21 DIAGNOSIS — M810 Age-related osteoporosis without current pathological fracture: Secondary | ICD-10-CM | POA: Diagnosis not present

## 2019-10-21 DIAGNOSIS — Z23 Encounter for immunization: Secondary | ICD-10-CM | POA: Diagnosis not present

## 2019-10-21 DIAGNOSIS — C8590 Non-Hodgkin lymphoma, unspecified, unspecified site: Secondary | ICD-10-CM | POA: Diagnosis not present

## 2019-10-21 DIAGNOSIS — M159 Polyosteoarthritis, unspecified: Secondary | ICD-10-CM | POA: Diagnosis not present

## 2019-10-22 DIAGNOSIS — L578 Other skin changes due to chronic exposure to nonionizing radiation: Secondary | ICD-10-CM | POA: Diagnosis not present

## 2019-10-22 DIAGNOSIS — L720 Epidermal cyst: Secondary | ICD-10-CM | POA: Diagnosis not present

## 2019-10-22 DIAGNOSIS — L821 Other seborrheic keratosis: Secondary | ICD-10-CM | POA: Diagnosis not present

## 2019-10-22 DIAGNOSIS — L820 Inflamed seborrheic keratosis: Secondary | ICD-10-CM | POA: Diagnosis not present

## 2020-06-05 ENCOUNTER — Telehealth: Payer: Self-pay | Admitting: Hematology and Oncology

## 2020-06-05 NOTE — Telephone Encounter (Signed)
Rescheduled appointment per provider. Patient is aware. 

## 2020-06-26 ENCOUNTER — Ambulatory Visit: Payer: Medicare Other | Admitting: Hematology and Oncology

## 2020-06-26 ENCOUNTER — Other Ambulatory Visit: Payer: Medicare Other

## 2020-06-30 ENCOUNTER — Inpatient Hospital Stay: Payer: Medicare HMO | Attending: Hematology and Oncology

## 2020-06-30 ENCOUNTER — Other Ambulatory Visit: Payer: Self-pay

## 2020-06-30 ENCOUNTER — Inpatient Hospital Stay: Payer: Medicare HMO | Admitting: Hematology and Oncology

## 2020-06-30 ENCOUNTER — Encounter: Payer: Self-pay | Admitting: Hematology and Oncology

## 2020-06-30 DIAGNOSIS — Z8572 Personal history of non-Hodgkin lymphomas: Secondary | ICD-10-CM | POA: Diagnosis present

## 2020-06-30 DIAGNOSIS — Z299 Encounter for prophylactic measures, unspecified: Secondary | ICD-10-CM

## 2020-06-30 DIAGNOSIS — Z85828 Personal history of other malignant neoplasm of skin: Secondary | ICD-10-CM

## 2020-06-30 DIAGNOSIS — Z08 Encounter for follow-up examination after completed treatment for malignant neoplasm: Secondary | ICD-10-CM | POA: Insufficient documentation

## 2020-06-30 DIAGNOSIS — Z8579 Personal history of other malignant neoplasms of lymphoid, hematopoietic and related tissues: Secondary | ICD-10-CM | POA: Diagnosis not present

## 2020-06-30 LAB — CBC WITH DIFFERENTIAL/PLATELET
Abs Immature Granulocytes: 0.01 10*3/uL (ref 0.00–0.07)
Basophils Absolute: 0.1 10*3/uL (ref 0.0–0.1)
Basophils Relative: 1 %
Eosinophils Absolute: 0.1 10*3/uL (ref 0.0–0.5)
Eosinophils Relative: 3 %
HCT: 41.7 % (ref 36.0–46.0)
Hemoglobin: 14.2 g/dL (ref 12.0–15.0)
Immature Granulocytes: 0 %
Lymphocytes Relative: 31 %
Lymphs Abs: 1.4 10*3/uL (ref 0.7–4.0)
MCH: 30.5 pg (ref 26.0–34.0)
MCHC: 34.1 g/dL (ref 30.0–36.0)
MCV: 89.7 fL (ref 80.0–100.0)
Monocytes Absolute: 0.6 10*3/uL (ref 0.1–1.0)
Monocytes Relative: 14 %
Neutro Abs: 2.4 10*3/uL (ref 1.7–7.7)
Neutrophils Relative %: 51 %
Platelets: 258 10*3/uL (ref 150–400)
RBC: 4.65 MIL/uL (ref 3.87–5.11)
RDW: 13.6 % (ref 11.5–15.5)
WBC: 4.7 10*3/uL (ref 4.0–10.5)
nRBC: 0 % (ref 0.0–0.2)

## 2020-06-30 LAB — CMP (CANCER CENTER ONLY)
ALT: 21 U/L (ref 0–44)
AST: 23 U/L (ref 15–41)
Albumin: 4.4 g/dL (ref 3.5–5.0)
Alkaline Phosphatase: 79 U/L (ref 38–126)
Anion gap: 6 (ref 5–15)
BUN: 22 mg/dL (ref 8–23)
CO2: 29 mmol/L (ref 22–32)
Calcium: 9.6 mg/dL (ref 8.9–10.3)
Chloride: 105 mmol/L (ref 98–111)
Creatinine: 0.82 mg/dL (ref 0.44–1.00)
GFR, Estimated: >60 mL/min (ref >60)
Glucose, Bld: 91 mg/dL (ref 70–99)
Potassium: 4.3 mmol/L (ref 3.5–5.1)
Sodium: 140 mmol/L (ref 135–145)
Total Bilirubin: 0.8 mg/dL (ref 0.3–1.2)
Total Protein: 6.8 g/dL (ref 6.5–8.1)

## 2020-06-30 NOTE — Assessment & Plan Note (Signed)
She had history of skin cancer We discussed the importance of avoiding excessive sun exposure and wearing sunscreen She will close follow-up with her dermatologist

## 2020-06-30 NOTE — Assessment & Plan Note (Signed)
She has mild intermittent back pain I recommend vitamin D supplement and strengthening exercises

## 2020-06-30 NOTE — Assessment & Plan Note (Signed)
Clinically, she had no signs of cancer recurrence Her exam is benign I recommend close follow-up with primary care doctor and to continue age-appropriate screening modalities The patient is educated to watch out for signs and symptoms of cancer recurrence She is a long-term cancer survivor, last treatment was 5 years ago I recommend discontinuation of follow-up. She is comfortable to follow-up with primary care doctor moving forward

## 2020-06-30 NOTE — Progress Notes (Signed)
[facility] OFFICE PROGRESS NOTE  Patient Care Team: Cher Nakai, MD as PCP - General (Internal Medicine) Coralie Keens, MD as Consulting Physician (General Surgery)  ASSESSMENT & PLAN:  History of lymphoma Clinically, she had no signs of cancer recurrence Her exam is benign I recommend close follow-up with primary care doctor and to continue age-appropriate screening modalities The patient is educated to watch out for signs and symptoms of cancer recurrence She is a long-term cancer survivor, last treatment was 5 years ago I recommend discontinuation of follow-up. She is comfortable to follow-up with primary care doctor moving forward  Hx of skin cancer, basal cell She had history of skin cancer We discussed the importance of avoiding excessive sun exposure and wearing sunscreen She will close follow-up with her dermatologist  Preventive measure She has mild intermittent back pain I recommend vitamin D supplement and strengthening exercises  Orders Placed This Encounter  Procedures   CMP ([place] only)    All questions were answered. The patient knows to call the clinic with any problems, questions or concerns. The total time spent in the appointment was 20 minutes encounter with patients including review of chart and various tests results, discussions about plan of care and coordination of care plan   Heath Lark, MD 06/30/2020 2:19 PM  INTERVAL HISTORY: Please see below for problem oriented charting. She returns with her husband for further follow-up She is doing well She exercises regularly No recent infection, fever or chills No new lymphadenopathy She complained of mild intermittent back discomfort She has stopped taking vitamin D supplement recently Denies new skin lesion  SUMMARY OF ONCOLOGIC HISTORY: Oncology History  History of lymphoma  01/07/2015 Procedure   She had excisional biopsy of left axilla    01/07/2015 Pathology Results    Accession: [id number] showed follicular and DLBCL    01/19/2015 Imaging   PET scan showed stage III disease    01/20/2015 Bone Marrow Biopsy   BM biopsy showed normal cytogenetics with possible involvement of low grade lymphoma.  Accession: [id number]    01/22/2015 Imaging   ECHo showed normal EF    01/27/2015 - 05/13/2015 Chemotherapy   She received R-CHOP chemo x 6    02/12/2015 Procedure   She had port placement    03/30/2015 Imaging   PET CT scan showed positive response to treatment    06/30/2015 PET scan   Mildly hypermetabolic right lower paratracheal lymph node, which has slightly decreased in size and metabolism.    09/28/2015 Imaging   Ct chest showed low right paratracheal lymph node is stable in size. No additional evidence of recurrent lymphoma    06/28/2016 Imaging   1. Stable precarinal and left hilar lymph nodes when compared to prior study. No new adenopathy in the mediastinum or hilar regions. 2. No axillary or supraclavicular adenopathy. No upper abdominal adenopathy. 3. No significant pulmonary findings. 4. Aortic Atherosclerosis (ICD10-I70.0).    05/24/2017 Imaging   Chest Impression:  1. No evidence of lymphoma recurrence. 2. Stable biapical pleural thickening. 3. Mild Coronary artery calcification and Aortic Atherosclerosis (ICD10-I70.0).  Abdomen / Pelvis Impression:  No lymphoma recurrence in the abdomen pelvis.      REVIEW OF SYSTEMS:   Constitutional: Denies fevers, chills or abnormal weight loss Eyes: Denies blurriness of vision Ears, nose, mouth, throat, and face: Denies mucositis or sore throat Respiratory: Denies cough, dyspnea or wheezes Cardiovascular: Denies palpitation, chest discomfort or lower extremity swelling Gastrointestinal:  Denies nausea, heartburn or change  in bowel habits Skin: Denies abnormal skin rashes Lymphatics: Denies new lymphadenopathy or easy bruising Neurological:Denies numbness, tingling or new  weaknesses Behavioral/Psych: Mood is stable, no new changes  All other systems were reviewed with the patient and are negative.  I have reviewed the past medical history, past surgical history, social history and family history with the patient and they are unchanged from previous note.  ALLERGIES:  has No Known Allergies.  MEDICATIONS:  Current Outpatient Medications  Medication Sig Dispense Refill   calcium carbonate (OSCAL) 1500 (600 CA) MG TABS tablet Take by mouth 2 (two) times daily with a meal.     cetirizine (ZYRTEC) 10 MG tablet Take 10 mg by mouth daily.     Cholecalciferol (VITAMIN D3 SUPER STRENGTH) 2000 units CAPS Take by mouth.     Omega-3 Fatty Acids (FISH OIL) 1200 MG CAPS Take by mouth.     No current facility-administered medications for this visit.    PHYSICAL EXAMINATION: ECOG PERFORMANCE STATUS: 0 - Asymptomatic  Vitals:   06/30/20 1203  BP: 110/67  Pulse: 83  Resp: 18  Temp: 97.6 F (36.4 C)  SpO2: 98%   Filed Weights   06/30/20 1203  Weight: 126 lb 3.2 oz (57.2 kg)    GENERAL:alert, no distress and comfortable SKIN: skin color, texture, turgor are normal, no rashes or significant lesions EYES: normal, Conjunctiva are pink and non-injected, sclera clear OROPHARYNX:no exudate, no erythema and lips, buccal mucosa, and tongue normal  NECK: supple, thyroid normal size, non-tender, without nodularity LYMPH:  no palpable lymphadenopathy in the cervical, axillary or inguinal LUNGS: clear to auscultation and percussion with normal breathing effort HEART: regular rate & rhythm and no murmurs and no lower extremity edema ABDOMEN:abdomen soft, non-tender and normal bowel sounds Musculoskeletal:no cyanosis of digits and no clubbing  NEURO: alert & oriented x 3 with fluent speech, no focal motor/sensory deficits  LABORATORY DATA:  I have reviewed the data as listed    Component Value Date/Time   NA 140 06/30/2020 1205   NA 140 10/25/2016 1457   K 4.3  06/30/2020 1205   K 4.2 10/25/2016 1457   CL 105 06/30/2020 1205   CO2 29 06/30/2020 1205   CO2 26 10/25/2016 1457   GLUCOSE 91 06/30/2020 1205   GLUCOSE 93 10/25/2016 1457   BUN 22 06/30/2020 1205   BUN 17.6 10/25/2016 1457   CREATININE 0.82 06/30/2020 1205   CREATININE 0.8 10/25/2016 1457   CALCIUM 9.6 06/30/2020 1205   CALCIUM 9.2 10/25/2016 1457   PROT 6.8 06/30/2020 1205   PROT 6.6 10/25/2016 1457   ALBUMIN 4.4 06/30/2020 1205   ALBUMIN 4.0 10/25/2016 1457   AST 23 06/30/2020 1205   AST 21 10/25/2016 1457   ALT 21 06/30/2020 1205   ALT 16 10/25/2016 1457   ALKPHOS 79 06/30/2020 1205   ALKPHOS 91 10/25/2016 1457   BILITOT 0.8 06/30/2020 1205   BILITOT 0.49 10/25/2016 1457   GFRNONAA >60 06/30/2020 1205   GFRAA >60 06/27/2019 1239    No results found for: SPEP, UPEP  Lab Results  Component Value Date   WBC 4.7 06/30/2020   NEUTROABS 2.4 06/30/2020   HGB 14.2 06/30/2020   HCT 41.7 06/30/2020   MCV 89.7 06/30/2020   PLT 258 06/30/2020      Chemistry      Component Value Date/Time   NA 140 06/30/2020 1205   NA 140 10/25/2016 1457   K 4.3 06/30/2020 1205   K 4.2 10/25/2016 1457  CL 105 06/30/2020 1205   CO2 29 06/30/2020 1205   CO2 26 10/25/2016 1457   BUN 22 06/30/2020 1205   BUN 17.6 10/25/2016 1457   CREATININE 0.82 06/30/2020 1205   CREATININE 0.8 10/25/2016 1457      Component Value Date/Time   CALCIUM 9.6 06/30/2020 1205   CALCIUM 9.2 10/25/2016 1457   ALKPHOS 79 06/30/2020 1205   ALKPHOS 91 10/25/2016 1457   AST 23 06/30/2020 1205   AST 21 10/25/2016 1457   ALT 21 06/30/2020 1205   ALT 16 10/25/2016 1457   BILITOT 0.8 06/30/2020 1205   BILITOT 0.49 10/25/2016 1457

## 2021-02-22 DIAGNOSIS — Z6822 Body mass index (BMI) 22.0-22.9, adult: Secondary | ICD-10-CM | POA: Diagnosis not present

## 2021-02-22 DIAGNOSIS — E785 Hyperlipidemia, unspecified: Secondary | ICD-10-CM | POA: Diagnosis not present

## 2021-02-22 DIAGNOSIS — M159 Polyosteoarthritis, unspecified: Secondary | ICD-10-CM | POA: Diagnosis not present

## 2021-02-22 DIAGNOSIS — J309 Allergic rhinitis, unspecified: Secondary | ICD-10-CM | POA: Diagnosis not present

## 2021-02-22 DIAGNOSIS — M810 Age-related osteoporosis without current pathological fracture: Secondary | ICD-10-CM | POA: Diagnosis not present

## 2021-02-22 DIAGNOSIS — C8590 Non-Hodgkin lymphoma, unspecified, unspecified site: Secondary | ICD-10-CM | POA: Diagnosis not present

## 2021-02-23 DIAGNOSIS — H11442 Conjunctival cysts, left eye: Secondary | ICD-10-CM | POA: Diagnosis not present

## 2021-03-12 DIAGNOSIS — Z1231 Encounter for screening mammogram for malignant neoplasm of breast: Secondary | ICD-10-CM | POA: Diagnosis not present

## 2021-03-29 DIAGNOSIS — H25813 Combined forms of age-related cataract, bilateral: Secondary | ICD-10-CM | POA: Diagnosis not present

## 2021-03-29 DIAGNOSIS — H524 Presbyopia: Secondary | ICD-10-CM | POA: Diagnosis not present

## 2021-04-07 DIAGNOSIS — R928 Other abnormal and inconclusive findings on diagnostic imaging of breast: Secondary | ICD-10-CM | POA: Diagnosis not present

## 2021-04-07 DIAGNOSIS — N6002 Solitary cyst of left breast: Secondary | ICD-10-CM | POA: Diagnosis not present

## 2021-06-22 DIAGNOSIS — M159 Polyosteoarthritis, unspecified: Secondary | ICD-10-CM | POA: Diagnosis not present

## 2021-06-22 DIAGNOSIS — C8590 Non-Hodgkin lymphoma, unspecified, unspecified site: Secondary | ICD-10-CM | POA: Diagnosis not present

## 2021-06-22 DIAGNOSIS — M810 Age-related osteoporosis without current pathological fracture: Secondary | ICD-10-CM | POA: Diagnosis not present

## 2021-06-22 DIAGNOSIS — E785 Hyperlipidemia, unspecified: Secondary | ICD-10-CM | POA: Diagnosis not present

## 2021-06-22 DIAGNOSIS — Z6822 Body mass index (BMI) 22.0-22.9, adult: Secondary | ICD-10-CM | POA: Diagnosis not present

## 2021-06-22 DIAGNOSIS — J309 Allergic rhinitis, unspecified: Secondary | ICD-10-CM | POA: Diagnosis not present

## 2021-07-26 DIAGNOSIS — N3001 Acute cystitis with hematuria: Secondary | ICD-10-CM | POA: Diagnosis not present

## 2021-07-26 DIAGNOSIS — N390 Urinary tract infection, site not specified: Secondary | ICD-10-CM | POA: Diagnosis not present

## 2021-09-21 DIAGNOSIS — R5382 Chronic fatigue, unspecified: Secondary | ICD-10-CM | POA: Diagnosis not present

## 2021-09-21 DIAGNOSIS — E785 Hyperlipidemia, unspecified: Secondary | ICD-10-CM | POA: Diagnosis not present

## 2021-09-21 DIAGNOSIS — R300 Dysuria: Secondary | ICD-10-CM | POA: Diagnosis not present

## 2021-09-21 DIAGNOSIS — M159 Polyosteoarthritis, unspecified: Secondary | ICD-10-CM | POA: Diagnosis not present

## 2021-09-21 DIAGNOSIS — M810 Age-related osteoporosis without current pathological fracture: Secondary | ICD-10-CM | POA: Diagnosis not present

## 2021-09-21 DIAGNOSIS — J309 Allergic rhinitis, unspecified: Secondary | ICD-10-CM | POA: Diagnosis not present

## 2021-09-21 DIAGNOSIS — Z6822 Body mass index (BMI) 22.0-22.9, adult: Secondary | ICD-10-CM | POA: Diagnosis not present

## 2021-09-21 DIAGNOSIS — C8590 Non-Hodgkin lymphoma, unspecified, unspecified site: Secondary | ICD-10-CM | POA: Diagnosis not present

## 2021-09-21 DIAGNOSIS — M25562 Pain in left knee: Secondary | ICD-10-CM | POA: Diagnosis not present

## 2021-10-07 DIAGNOSIS — E785 Hyperlipidemia, unspecified: Secondary | ICD-10-CM | POA: Diagnosis not present

## 2021-10-07 DIAGNOSIS — J309 Allergic rhinitis, unspecified: Secondary | ICD-10-CM | POA: Diagnosis not present

## 2021-10-07 DIAGNOSIS — C8590 Non-Hodgkin lymphoma, unspecified, unspecified site: Secondary | ICD-10-CM | POA: Diagnosis not present

## 2021-10-07 DIAGNOSIS — M810 Age-related osteoporosis without current pathological fracture: Secondary | ICD-10-CM | POA: Diagnosis not present

## 2021-10-07 DIAGNOSIS — Z6821 Body mass index (BMI) 21.0-21.9, adult: Secondary | ICD-10-CM | POA: Diagnosis not present

## 2021-10-07 DIAGNOSIS — M159 Polyosteoarthritis, unspecified: Secondary | ICD-10-CM | POA: Diagnosis not present

## 2022-06-14 DIAGNOSIS — L03113 Cellulitis of right upper limb: Secondary | ICD-10-CM | POA: Diagnosis not present

## 2022-06-14 DIAGNOSIS — M810 Age-related osteoporosis without current pathological fracture: Secondary | ICD-10-CM | POA: Diagnosis not present

## 2022-06-14 DIAGNOSIS — Z681 Body mass index (BMI) 19 or less, adult: Secondary | ICD-10-CM | POA: Diagnosis not present

## 2022-06-14 DIAGNOSIS — J309 Allergic rhinitis, unspecified: Secondary | ICD-10-CM | POA: Diagnosis not present

## 2022-06-14 DIAGNOSIS — M159 Polyosteoarthritis, unspecified: Secondary | ICD-10-CM | POA: Diagnosis not present

## 2022-06-14 DIAGNOSIS — C8590 Non-Hodgkin lymphoma, unspecified, unspecified site: Secondary | ICD-10-CM | POA: Diagnosis not present

## 2022-06-14 DIAGNOSIS — E785 Hyperlipidemia, unspecified: Secondary | ICD-10-CM | POA: Diagnosis not present

## 2022-06-14 DIAGNOSIS — M79652 Pain in left thigh: Secondary | ICD-10-CM | POA: Diagnosis not present

## 2022-06-20 DIAGNOSIS — S32599A Other specified fracture of unspecified pubis, initial encounter for closed fracture: Secondary | ICD-10-CM | POA: Diagnosis not present

## 2022-06-20 DIAGNOSIS — S8012XA Contusion of left lower leg, initial encounter: Secondary | ICD-10-CM | POA: Diagnosis not present

## 2022-06-20 DIAGNOSIS — S79922A Unspecified injury of left thigh, initial encounter: Secondary | ICD-10-CM | POA: Diagnosis not present

## 2022-06-20 DIAGNOSIS — S32019A Unspecified fracture of first lumbar vertebra, initial encounter for closed fracture: Secondary | ICD-10-CM | POA: Diagnosis not present

## 2022-06-20 DIAGNOSIS — S3992XA Unspecified injury of lower back, initial encounter: Secondary | ICD-10-CM | POA: Diagnosis not present

## 2022-06-20 DIAGNOSIS — M25552 Pain in left hip: Secondary | ICD-10-CM | POA: Diagnosis not present

## 2022-06-20 DIAGNOSIS — S32511A Fracture of superior rim of right pubis, initial encounter for closed fracture: Secondary | ICD-10-CM | POA: Diagnosis not present

## 2022-06-20 DIAGNOSIS — S22059A Unspecified fracture of T5-T6 vertebra, initial encounter for closed fracture: Secondary | ICD-10-CM | POA: Diagnosis not present

## 2022-06-20 DIAGNOSIS — S99922A Unspecified injury of left foot, initial encounter: Secondary | ICD-10-CM | POA: Diagnosis not present

## 2022-06-20 DIAGNOSIS — S299XXA Unspecified injury of thorax, initial encounter: Secondary | ICD-10-CM | POA: Diagnosis not present

## 2022-06-20 DIAGNOSIS — S32010A Wedge compression fracture of first lumbar vertebra, initial encounter for closed fracture: Secondary | ICD-10-CM | POA: Diagnosis not present

## 2022-06-20 DIAGNOSIS — Z8572 Personal history of non-Hodgkin lymphomas: Secondary | ICD-10-CM | POA: Diagnosis not present

## 2022-06-20 DIAGNOSIS — M5134 Other intervertebral disc degeneration, thoracic region: Secondary | ICD-10-CM | POA: Diagnosis not present

## 2022-06-20 DIAGNOSIS — S72111A Displaced fracture of greater trochanter of right femur, initial encounter for closed fracture: Secondary | ICD-10-CM | POA: Diagnosis not present

## 2022-06-20 DIAGNOSIS — S199XXA Unspecified injury of neck, initial encounter: Secondary | ICD-10-CM | POA: Diagnosis not present

## 2022-06-20 DIAGNOSIS — M7989 Other specified soft tissue disorders: Secondary | ICD-10-CM | POA: Diagnosis not present

## 2022-06-20 DIAGNOSIS — S32591A Other specified fracture of right pubis, initial encounter for closed fracture: Secondary | ICD-10-CM | POA: Diagnosis not present

## 2022-06-20 DIAGNOSIS — M4856XA Collapsed vertebra, not elsewhere classified, lumbar region, initial encounter for fracture: Secondary | ICD-10-CM | POA: Diagnosis not present

## 2022-06-20 DIAGNOSIS — S32512A Fracture of superior rim of left pubis, initial encounter for closed fracture: Secondary | ICD-10-CM | POA: Diagnosis not present

## 2022-06-20 DIAGNOSIS — S32592A Other specified fracture of left pubis, initial encounter for closed fracture: Secondary | ICD-10-CM | POA: Diagnosis not present

## 2022-06-20 DIAGNOSIS — S0990XA Unspecified injury of head, initial encounter: Secondary | ICD-10-CM | POA: Diagnosis not present

## 2022-06-20 DIAGNOSIS — Z7409 Other reduced mobility: Secondary | ICD-10-CM | POA: Diagnosis not present

## 2022-06-20 DIAGNOSIS — T1490XA Injury, unspecified, initial encounter: Secondary | ICD-10-CM | POA: Diagnosis not present

## 2022-06-24 DIAGNOSIS — S72111D Displaced fracture of greater trochanter of right femur, subsequent encounter for closed fracture with routine healing: Secondary | ICD-10-CM | POA: Diagnosis not present

## 2022-06-24 DIAGNOSIS — Z8579 Personal history of other malignant neoplasms of lymphoid, hematopoietic and related tissues: Secondary | ICD-10-CM | POA: Diagnosis not present

## 2022-06-24 DIAGNOSIS — S32591K Other specified fracture of right pubis, subsequent encounter for fracture with nonunion: Secondary | ICD-10-CM | POA: Diagnosis not present

## 2022-06-24 DIAGNOSIS — S32592D Other specified fracture of left pubis, subsequent encounter for fracture with routine healing: Secondary | ICD-10-CM | POA: Diagnosis not present

## 2022-06-24 DIAGNOSIS — S32010D Wedge compression fracture of first lumbar vertebra, subsequent encounter for fracture with routine healing: Secondary | ICD-10-CM | POA: Diagnosis not present

## 2022-06-29 DIAGNOSIS — S32592D Other specified fracture of left pubis, subsequent encounter for fracture with routine healing: Secondary | ICD-10-CM | POA: Diagnosis not present

## 2022-06-29 DIAGNOSIS — S32010D Wedge compression fracture of first lumbar vertebra, subsequent encounter for fracture with routine healing: Secondary | ICD-10-CM | POA: Diagnosis not present

## 2022-06-29 DIAGNOSIS — S72111D Displaced fracture of greater trochanter of right femur, subsequent encounter for closed fracture with routine healing: Secondary | ICD-10-CM | POA: Diagnosis not present

## 2022-06-29 DIAGNOSIS — Z8579 Personal history of other malignant neoplasms of lymphoid, hematopoietic and related tissues: Secondary | ICD-10-CM | POA: Diagnosis not present

## 2022-06-29 DIAGNOSIS — S32591K Other specified fracture of right pubis, subsequent encounter for fracture with nonunion: Secondary | ICD-10-CM | POA: Diagnosis not present

## 2022-06-30 DIAGNOSIS — S32010D Wedge compression fracture of first lumbar vertebra, subsequent encounter for fracture with routine healing: Secondary | ICD-10-CM | POA: Diagnosis not present

## 2022-06-30 DIAGNOSIS — S72111D Displaced fracture of greater trochanter of right femur, subsequent encounter for closed fracture with routine healing: Secondary | ICD-10-CM | POA: Diagnosis not present

## 2022-06-30 DIAGNOSIS — Z8579 Personal history of other malignant neoplasms of lymphoid, hematopoietic and related tissues: Secondary | ICD-10-CM | POA: Diagnosis not present

## 2022-06-30 DIAGNOSIS — S32592D Other specified fracture of left pubis, subsequent encounter for fracture with routine healing: Secondary | ICD-10-CM | POA: Diagnosis not present

## 2022-06-30 DIAGNOSIS — S32591K Other specified fracture of right pubis, subsequent encounter for fracture with nonunion: Secondary | ICD-10-CM | POA: Diagnosis not present

## 2022-07-01 DIAGNOSIS — S32592D Other specified fracture of left pubis, subsequent encounter for fracture with routine healing: Secondary | ICD-10-CM | POA: Diagnosis not present

## 2022-07-01 DIAGNOSIS — S32010D Wedge compression fracture of first lumbar vertebra, subsequent encounter for fracture with routine healing: Secondary | ICD-10-CM | POA: Diagnosis not present

## 2022-07-01 DIAGNOSIS — Z8579 Personal history of other malignant neoplasms of lymphoid, hematopoietic and related tissues: Secondary | ICD-10-CM | POA: Diagnosis not present

## 2022-07-01 DIAGNOSIS — S32591K Other specified fracture of right pubis, subsequent encounter for fracture with nonunion: Secondary | ICD-10-CM | POA: Diagnosis not present

## 2022-07-01 DIAGNOSIS — S72111D Displaced fracture of greater trochanter of right femur, subsequent encounter for closed fracture with routine healing: Secondary | ICD-10-CM | POA: Diagnosis not present

## 2022-07-04 DIAGNOSIS — S72111D Displaced fracture of greater trochanter of right femur, subsequent encounter for closed fracture with routine healing: Secondary | ICD-10-CM | POA: Diagnosis not present

## 2022-07-04 DIAGNOSIS — S32591K Other specified fracture of right pubis, subsequent encounter for fracture with nonunion: Secondary | ICD-10-CM | POA: Diagnosis not present

## 2022-07-04 DIAGNOSIS — S32010D Wedge compression fracture of first lumbar vertebra, subsequent encounter for fracture with routine healing: Secondary | ICD-10-CM | POA: Diagnosis not present

## 2022-07-04 DIAGNOSIS — S32592D Other specified fracture of left pubis, subsequent encounter for fracture with routine healing: Secondary | ICD-10-CM | POA: Diagnosis not present

## 2022-07-04 DIAGNOSIS — Z8579 Personal history of other malignant neoplasms of lymphoid, hematopoietic and related tissues: Secondary | ICD-10-CM | POA: Diagnosis not present

## 2022-07-05 DIAGNOSIS — S32591K Other specified fracture of right pubis, subsequent encounter for fracture with nonunion: Secondary | ICD-10-CM | POA: Diagnosis not present

## 2022-07-05 DIAGNOSIS — Z8579 Personal history of other malignant neoplasms of lymphoid, hematopoietic and related tissues: Secondary | ICD-10-CM | POA: Diagnosis not present

## 2022-07-05 DIAGNOSIS — S32010D Wedge compression fracture of first lumbar vertebra, subsequent encounter for fracture with routine healing: Secondary | ICD-10-CM | POA: Diagnosis not present

## 2022-07-05 DIAGNOSIS — S32592D Other specified fracture of left pubis, subsequent encounter for fracture with routine healing: Secondary | ICD-10-CM | POA: Diagnosis not present

## 2022-07-05 DIAGNOSIS — S72111D Displaced fracture of greater trochanter of right femur, subsequent encounter for closed fracture with routine healing: Secondary | ICD-10-CM | POA: Diagnosis not present

## 2022-07-07 DIAGNOSIS — S32592D Other specified fracture of left pubis, subsequent encounter for fracture with routine healing: Secondary | ICD-10-CM | POA: Diagnosis not present

## 2022-07-07 DIAGNOSIS — Z8579 Personal history of other malignant neoplasms of lymphoid, hematopoietic and related tissues: Secondary | ICD-10-CM | POA: Diagnosis not present

## 2022-07-07 DIAGNOSIS — S32010D Wedge compression fracture of first lumbar vertebra, subsequent encounter for fracture with routine healing: Secondary | ICD-10-CM | POA: Diagnosis not present

## 2022-07-07 DIAGNOSIS — S32591K Other specified fracture of right pubis, subsequent encounter for fracture with nonunion: Secondary | ICD-10-CM | POA: Diagnosis not present

## 2022-07-07 DIAGNOSIS — S72111D Displaced fracture of greater trochanter of right femur, subsequent encounter for closed fracture with routine healing: Secondary | ICD-10-CM | POA: Diagnosis not present

## 2022-08-01 DIAGNOSIS — S32010D Wedge compression fracture of first lumbar vertebra, subsequent encounter for fracture with routine healing: Secondary | ICD-10-CM | POA: Diagnosis not present

## 2022-08-01 DIAGNOSIS — S3282XD Multiple fractures of pelvis without disruption of pelvic ring, subsequent encounter for fracture with routine healing: Secondary | ICD-10-CM | POA: Diagnosis not present

## 2022-08-01 DIAGNOSIS — M4856XA Collapsed vertebra, not elsewhere classified, lumbar region, initial encounter for fracture: Secondary | ICD-10-CM | POA: Diagnosis not present

## 2022-08-04 DIAGNOSIS — S32010G Wedge compression fracture of first lumbar vertebra, subsequent encounter for fracture with delayed healing: Secondary | ICD-10-CM | POA: Diagnosis not present

## 2022-08-04 DIAGNOSIS — S22000A Wedge compression fracture of unspecified thoracic vertebra, initial encounter for closed fracture: Secondary | ICD-10-CM | POA: Diagnosis not present

## 2022-08-11 DIAGNOSIS — S329XXA Fracture of unspecified parts of lumbosacral spine and pelvis, initial encounter for closed fracture: Secondary | ICD-10-CM | POA: Diagnosis not present

## 2022-08-11 DIAGNOSIS — Z79899 Other long term (current) drug therapy: Secondary | ICD-10-CM | POA: Diagnosis not present

## 2022-08-11 DIAGNOSIS — M810 Age-related osteoporosis without current pathological fracture: Secondary | ICD-10-CM | POA: Diagnosis not present

## 2022-08-11 DIAGNOSIS — E7800 Pure hypercholesterolemia, unspecified: Secondary | ICD-10-CM | POA: Diagnosis not present

## 2022-09-02 DIAGNOSIS — H25043 Posterior subcapsular polar age-related cataract, bilateral: Secondary | ICD-10-CM | POA: Diagnosis not present

## 2022-09-02 DIAGNOSIS — H2513 Age-related nuclear cataract, bilateral: Secondary | ICD-10-CM | POA: Diagnosis not present

## 2022-09-02 DIAGNOSIS — H25013 Cortical age-related cataract, bilateral: Secondary | ICD-10-CM | POA: Diagnosis not present

## 2022-09-02 DIAGNOSIS — H18413 Arcus senilis, bilateral: Secondary | ICD-10-CM | POA: Diagnosis not present

## 2022-09-02 DIAGNOSIS — H2512 Age-related nuclear cataract, left eye: Secondary | ICD-10-CM | POA: Diagnosis not present

## 2022-09-07 DIAGNOSIS — M810 Age-related osteoporosis without current pathological fracture: Secondary | ICD-10-CM | POA: Diagnosis not present

## 2022-09-15 DIAGNOSIS — H25812 Combined forms of age-related cataract, left eye: Secondary | ICD-10-CM | POA: Diagnosis not present

## 2022-09-16 DIAGNOSIS — H2511 Age-related nuclear cataract, right eye: Secondary | ICD-10-CM | POA: Diagnosis not present

## 2022-09-16 DIAGNOSIS — H2512 Age-related nuclear cataract, left eye: Secondary | ICD-10-CM | POA: Diagnosis not present

## 2022-10-10 DIAGNOSIS — S329XXA Fracture of unspecified parts of lumbosacral spine and pelvis, initial encounter for closed fracture: Secondary | ICD-10-CM | POA: Diagnosis not present

## 2022-10-10 DIAGNOSIS — M810 Age-related osteoporosis without current pathological fracture: Secondary | ICD-10-CM | POA: Diagnosis not present

## 2022-10-10 DIAGNOSIS — Z681 Body mass index (BMI) 19 or less, adult: Secondary | ICD-10-CM | POA: Diagnosis not present

## 2022-10-10 DIAGNOSIS — M1990 Unspecified osteoarthritis, unspecified site: Secondary | ICD-10-CM | POA: Diagnosis not present

## 2022-10-14 DIAGNOSIS — H2511 Age-related nuclear cataract, right eye: Secondary | ICD-10-CM | POA: Diagnosis not present

## 2022-10-21 DIAGNOSIS — Z23 Encounter for immunization: Secondary | ICD-10-CM | POA: Diagnosis not present

## 2022-11-09 DIAGNOSIS — M1990 Unspecified osteoarthritis, unspecified site: Secondary | ICD-10-CM | POA: Diagnosis not present

## 2022-11-09 DIAGNOSIS — Z681 Body mass index (BMI) 19 or less, adult: Secondary | ICD-10-CM | POA: Diagnosis not present

## 2022-11-09 DIAGNOSIS — Z8781 Personal history of (healed) traumatic fracture: Secondary | ICD-10-CM | POA: Diagnosis not present

## 2022-11-09 DIAGNOSIS — M810 Age-related osteoporosis without current pathological fracture: Secondary | ICD-10-CM | POA: Diagnosis not present

## 2022-11-11 DIAGNOSIS — M47816 Spondylosis without myelopathy or radiculopathy, lumbar region: Secondary | ICD-10-CM | POA: Diagnosis not present

## 2022-11-11 DIAGNOSIS — M419 Scoliosis, unspecified: Secondary | ICD-10-CM | POA: Diagnosis not present

## 2022-11-11 DIAGNOSIS — S32010A Wedge compression fracture of first lumbar vertebra, initial encounter for closed fracture: Secondary | ICD-10-CM | POA: Diagnosis not present

## 2022-11-29 DIAGNOSIS — M47816 Spondylosis without myelopathy or radiculopathy, lumbar region: Secondary | ICD-10-CM | POA: Diagnosis not present

## 2022-12-19 DIAGNOSIS — M47816 Spondylosis without myelopathy or radiculopathy, lumbar region: Secondary | ICD-10-CM | POA: Diagnosis not present

## 2023-01-08 DIAGNOSIS — N3001 Acute cystitis with hematuria: Secondary | ICD-10-CM | POA: Diagnosis not present

## 2023-01-08 DIAGNOSIS — N3091 Cystitis, unspecified with hematuria: Secondary | ICD-10-CM | POA: Diagnosis not present

## 2023-01-10 DIAGNOSIS — M47816 Spondylosis without myelopathy or radiculopathy, lumbar region: Secondary | ICD-10-CM | POA: Diagnosis not present

## 2023-01-24 DIAGNOSIS — Z681 Body mass index (BMI) 19 or less, adult: Secondary | ICD-10-CM | POA: Diagnosis not present

## 2023-01-24 DIAGNOSIS — Z23 Encounter for immunization: Secondary | ICD-10-CM | POA: Diagnosis not present

## 2023-01-24 DIAGNOSIS — Z0000 Encounter for general adult medical examination without abnormal findings: Secondary | ICD-10-CM | POA: Diagnosis not present

## 2023-01-24 DIAGNOSIS — M810 Age-related osteoporosis without current pathological fracture: Secondary | ICD-10-CM | POA: Diagnosis not present

## 2023-01-24 DIAGNOSIS — Z79899 Other long term (current) drug therapy: Secondary | ICD-10-CM | POA: Diagnosis not present

## 2023-01-24 DIAGNOSIS — E7800 Pure hypercholesterolemia, unspecified: Secondary | ICD-10-CM | POA: Diagnosis not present

## 2023-01-24 DIAGNOSIS — M1990 Unspecified osteoarthritis, unspecified site: Secondary | ICD-10-CM | POA: Diagnosis not present

## 2023-01-24 DIAGNOSIS — Z9181 History of falling: Secondary | ICD-10-CM | POA: Diagnosis not present

## 2023-01-24 DIAGNOSIS — M47817 Spondylosis without myelopathy or radiculopathy, lumbosacral region: Secondary | ICD-10-CM | POA: Diagnosis not present

## 2023-01-24 DIAGNOSIS — Z8781 Personal history of (healed) traumatic fracture: Secondary | ICD-10-CM | POA: Diagnosis not present

## 2023-02-10 DIAGNOSIS — M791 Myalgia, unspecified site: Secondary | ICD-10-CM | POA: Diagnosis not present

## 2023-02-10 DIAGNOSIS — M47816 Spondylosis without myelopathy or radiculopathy, lumbar region: Secondary | ICD-10-CM | POA: Diagnosis not present

## 2023-03-17 DIAGNOSIS — M791 Myalgia, unspecified site: Secondary | ICD-10-CM | POA: Diagnosis not present

## 2023-03-17 DIAGNOSIS — M47816 Spondylosis without myelopathy or radiculopathy, lumbar region: Secondary | ICD-10-CM | POA: Diagnosis not present

## 2023-03-17 DIAGNOSIS — M5416 Radiculopathy, lumbar region: Secondary | ICD-10-CM | POA: Diagnosis not present

## 2023-04-03 DIAGNOSIS — M5416 Radiculopathy, lumbar region: Secondary | ICD-10-CM | POA: Diagnosis not present

## 2023-04-10 DIAGNOSIS — Z1231 Encounter for screening mammogram for malignant neoplasm of breast: Secondary | ICD-10-CM | POA: Diagnosis not present

## 2023-04-27 DIAGNOSIS — L821 Other seborrheic keratosis: Secondary | ICD-10-CM | POA: Diagnosis not present

## 2023-04-27 DIAGNOSIS — L570 Actinic keratosis: Secondary | ICD-10-CM | POA: Diagnosis not present

## 2023-04-27 DIAGNOSIS — L578 Other skin changes due to chronic exposure to nonionizing radiation: Secondary | ICD-10-CM | POA: Diagnosis not present

## 2023-05-05 DIAGNOSIS — M791 Myalgia, unspecified site: Secondary | ICD-10-CM | POA: Diagnosis not present

## 2023-05-05 DIAGNOSIS — M47816 Spondylosis without myelopathy or radiculopathy, lumbar region: Secondary | ICD-10-CM | POA: Diagnosis not present

## 2023-05-05 DIAGNOSIS — M5416 Radiculopathy, lumbar region: Secondary | ICD-10-CM | POA: Diagnosis not present

## 2023-06-16 DIAGNOSIS — M47816 Spondylosis without myelopathy or radiculopathy, lumbar region: Secondary | ICD-10-CM | POA: Diagnosis not present

## 2023-06-16 DIAGNOSIS — M791 Myalgia, unspecified site: Secondary | ICD-10-CM | POA: Diagnosis not present

## 2023-06-23 DIAGNOSIS — M1990 Unspecified osteoarthritis, unspecified site: Secondary | ICD-10-CM | POA: Diagnosis not present

## 2023-06-23 DIAGNOSIS — E7800 Pure hypercholesterolemia, unspecified: Secondary | ICD-10-CM | POA: Diagnosis not present

## 2023-06-23 DIAGNOSIS — Z8781 Personal history of (healed) traumatic fracture: Secondary | ICD-10-CM | POA: Diagnosis not present

## 2023-06-23 DIAGNOSIS — Z681 Body mass index (BMI) 19 or less, adult: Secondary | ICD-10-CM | POA: Diagnosis not present

## 2023-06-23 DIAGNOSIS — M810 Age-related osteoporosis without current pathological fracture: Secondary | ICD-10-CM | POA: Diagnosis not present

## 2023-06-23 DIAGNOSIS — M47817 Spondylosis without myelopathy or radiculopathy, lumbosacral region: Secondary | ICD-10-CM | POA: Diagnosis not present

## 2023-06-23 DIAGNOSIS — Z79899 Other long term (current) drug therapy: Secondary | ICD-10-CM | POA: Diagnosis not present

## 2023-10-17 DIAGNOSIS — H43813 Vitreous degeneration, bilateral: Secondary | ICD-10-CM | POA: Diagnosis not present

## 2023-10-17 DIAGNOSIS — H26493 Other secondary cataract, bilateral: Secondary | ICD-10-CM | POA: Diagnosis not present

## 2023-10-17 DIAGNOSIS — Z961 Presence of intraocular lens: Secondary | ICD-10-CM | POA: Diagnosis not present

## 2023-10-24 DIAGNOSIS — Z681 Body mass index (BMI) 19 or less, adult: Secondary | ICD-10-CM | POA: Diagnosis not present

## 2023-10-24 DIAGNOSIS — E7800 Pure hypercholesterolemia, unspecified: Secondary | ICD-10-CM | POA: Diagnosis not present

## 2023-10-24 DIAGNOSIS — M810 Age-related osteoporosis without current pathological fracture: Secondary | ICD-10-CM | POA: Diagnosis not present

## 2023-10-24 DIAGNOSIS — Z23 Encounter for immunization: Secondary | ICD-10-CM | POA: Diagnosis not present

## 2023-10-24 DIAGNOSIS — Z8781 Personal history of (healed) traumatic fracture: Secondary | ICD-10-CM | POA: Diagnosis not present

## 2023-10-24 DIAGNOSIS — M47817 Spondylosis without myelopathy or radiculopathy, lumbosacral region: Secondary | ICD-10-CM | POA: Diagnosis not present

## 2023-10-24 DIAGNOSIS — M1990 Unspecified osteoarthritis, unspecified site: Secondary | ICD-10-CM | POA: Diagnosis not present
